# Patient Record
Sex: Female | Born: 1980 | Race: Black or African American | Hispanic: No | Marital: Single | Smoking: Current every day smoker
Health system: Southern US, Community
[De-identification: ages and names within clinical notes are randomized; demographics above are authoritative.]

## PROBLEM LIST (undated history)

## (undated) ENCOUNTER — Emergency Department (HOSPITAL_COMMUNITY): Admission: EM | Payer: Self-pay | Source: Home / Self Care

## (undated) DIAGNOSIS — F419 Anxiety disorder, unspecified: Secondary | ICD-10-CM

## (undated) DIAGNOSIS — D649 Anemia, unspecified: Secondary | ICD-10-CM

## (undated) DIAGNOSIS — N7093 Salpingitis and oophoritis, unspecified: Secondary | ICD-10-CM

## (undated) DIAGNOSIS — N730 Acute parametritis and pelvic cellulitis: Secondary | ICD-10-CM

## (undated) DIAGNOSIS — R7303 Prediabetes: Secondary | ICD-10-CM

## (undated) DIAGNOSIS — Z87828 Personal history of other (healed) physical injury and trauma: Secondary | ICD-10-CM

---

## 2013-02-21 HISTORY — PX: LAPAROSCOPIC APPENDECTOMY: SUR753

## 2018-05-23 DIAGNOSIS — N7093 Salpingitis and oophoritis, unspecified: Secondary | ICD-10-CM

## 2018-05-23 HISTORY — DX: Salpingitis and oophoritis, unspecified: N70.93

## 2018-06-16 ENCOUNTER — Emergency Department (HOSPITAL_COMMUNITY): Payer: Self-pay

## 2018-06-16 ENCOUNTER — Inpatient Hospital Stay (HOSPITAL_COMMUNITY)
Admission: EM | Admit: 2018-06-16 | Discharge: 2018-06-19 | DRG: 759 | Disposition: A | Discharge status: Home / Self Care | Payer: Self-pay | Attending: Obstetrics and Gynecology | Admitting: Obstetrics and Gynecology

## 2018-06-16 ENCOUNTER — Encounter (HOSPITAL_COMMUNITY): Payer: Self-pay | Admitting: Emergency Medicine

## 2018-06-16 ENCOUNTER — Other Ambulatory Visit: Payer: Self-pay

## 2018-06-16 DIAGNOSIS — N730 Acute parametritis and pelvic cellulitis: Secondary | ICD-10-CM | POA: Diagnosis present

## 2018-06-16 DIAGNOSIS — Z79899 Other long term (current) drug therapy: Secondary | ICD-10-CM

## 2018-06-16 DIAGNOSIS — R339 Retention of urine, unspecified: Secondary | ICD-10-CM | POA: Diagnosis present

## 2018-06-16 DIAGNOSIS — N939 Abnormal uterine and vaginal bleeding, unspecified: Secondary | ICD-10-CM | POA: Diagnosis present

## 2018-06-16 DIAGNOSIS — R7401 Elevation of levels of liver transaminase levels: Secondary | ICD-10-CM | POA: Diagnosis present

## 2018-06-16 DIAGNOSIS — R109 Unspecified abdominal pain: Secondary | ICD-10-CM | POA: Diagnosis present

## 2018-06-16 DIAGNOSIS — R338 Other retention of urine: Secondary | ICD-10-CM

## 2018-06-16 DIAGNOSIS — R740 Nonspecific elevation of levels of transaminase and lactic acid dehydrogenase [LDH]: Secondary | ICD-10-CM | POA: Diagnosis present

## 2018-06-16 DIAGNOSIS — K000 Anodontia: Secondary | ICD-10-CM

## 2018-06-16 DIAGNOSIS — K5900 Constipation, unspecified: Secondary | ICD-10-CM | POA: Diagnosis present

## 2018-06-16 DIAGNOSIS — K08109 Complete loss of teeth, unspecified cause, unspecified class: Secondary | ICD-10-CM | POA: Diagnosis present

## 2018-06-16 DIAGNOSIS — Z79891 Long term (current) use of opiate analgesic: Secondary | ICD-10-CM

## 2018-06-16 DIAGNOSIS — N7093 Salpingitis and oophoritis, unspecified: Principal | ICD-10-CM | POA: Diagnosis present

## 2018-06-16 HISTORY — DX: Salpingitis and oophoritis, unspecified: N70.93

## 2018-06-16 HISTORY — DX: Acute parametritis and pelvic cellulitis: N73.0

## 2018-06-16 LAB — WET PREP, GENITAL
Sperm: NONE SEEN
Trich, Wet Prep: NONE SEEN
Yeast Wet Prep HPF POC: NONE SEEN

## 2018-06-16 LAB — URINALYSIS, ROUTINE W REFLEX MICROSCOPIC
Bacteria, UA: NONE SEEN
Bilirubin Urine: NEGATIVE
Glucose, UA: NEGATIVE mg/dL
Ketones, ur: 20 mg/dL — AB
Leukocytes,Ua: NEGATIVE
Nitrite: NEGATIVE
Protein, ur: NEGATIVE mg/dL
Specific Gravity, Urine: >1.046 — ABNORMAL HIGH (ref 1.005–1.030)
pH: 7 (ref 5.0–8.0)

## 2018-06-16 LAB — CBC WITH DIFFERENTIAL/PLATELET
Abs Immature Granulocytes: 0.06 10*3/uL (ref 0.00–0.07)
Basophils Absolute: 0 10*3/uL (ref 0.0–0.1)
Basophils Relative: 0 %
Eosinophils Absolute: 0 10*3/uL (ref 0.0–0.5)
Eosinophils Relative: 0 %
HCT: 39.2 % (ref 36.0–46.0)
Hemoglobin: 12.4 g/dL (ref 12.0–15.0)
Immature Granulocytes: 0 %
Lymphocytes Relative: 6 %
Lymphs Abs: 0.9 10*3/uL (ref 0.7–4.0)
MCH: 28.2 pg (ref 26.0–34.0)
MCHC: 31.6 g/dL (ref 30.0–36.0)
MCV: 89.3 fL (ref 80.0–100.0)
Monocytes Absolute: 0.7 10*3/uL (ref 0.1–1.0)
Monocytes Relative: 5 %
Neutro Abs: 12.9 10*3/uL — ABNORMAL HIGH (ref 1.7–7.7)
Neutrophils Relative %: 89 %
Platelets: 383 10*3/uL (ref 150–400)
RBC: 4.39 MIL/uL (ref 3.87–5.11)
RDW: 13.2 % (ref 11.5–15.5)
WBC: 14.6 10*3/uL — ABNORMAL HIGH (ref 4.0–10.5)
nRBC: 0 % (ref 0.0–0.2)

## 2018-06-16 LAB — COMPREHENSIVE METABOLIC PANEL
ALT: 51 U/L — ABNORMAL HIGH (ref 0–44)
AST: 42 U/L — ABNORMAL HIGH (ref 15–41)
Albumin: 3.6 g/dL (ref 3.5–5.0)
Alkaline Phosphatase: 81 U/L (ref 38–126)
Anion gap: 11 (ref 5–15)
BUN: <5 mg/dL — ABNORMAL LOW (ref 6–20)
CO2: 25 mmol/L (ref 22–32)
Calcium: 9.3 mg/dL (ref 8.9–10.3)
Chloride: 102 mmol/L (ref 98–111)
Creatinine, Ser: 0.8 mg/dL (ref 0.44–1.00)
GFR calc Af Amer: >60 mL/min (ref >60)
GFR calc non Af Amer: >60 mL/min (ref >60)
Glucose, Bld: 108 mg/dL — ABNORMAL HIGH (ref 70–99)
Potassium: 4 mmol/L (ref 3.5–5.1)
Sodium: 138 mmol/L (ref 135–145)
Total Bilirubin: 0.5 mg/dL (ref 0.3–1.2)
Total Protein: 7.3 g/dL (ref 6.5–8.1)

## 2018-06-16 LAB — I-STAT BETA HCG BLOOD, ED (MC, WL, AP ONLY): I-stat hCG, quantitative: <5 m[IU]/mL (ref <5)

## 2018-06-16 MED ORDER — IOHEXOL 300 MG/ML  SOLN
100.0000 mL | Freq: Once | INTRAMUSCULAR | Status: AC | PRN
  Administered 2018-06-16: 100 mL via INTRAVENOUS

## 2018-06-16 MED ORDER — SODIUM CHLORIDE 0.9 % IV BOLUS
1000.0000 mL | Freq: Once | INTRAVENOUS | Status: AC
  Administered 2018-06-16: 20:00:00 1000 mL via INTRAVENOUS

## 2018-06-16 MED ORDER — SODIUM CHLORIDE 0.9 % IV SOLN
2.0000 g | Freq: Four times a day (QID) | INTRAVENOUS | Status: DC
  Administered 2018-06-17 – 2018-06-19 (×12): 2 g via INTRAVENOUS
  Filled 2018-06-16 (×17): qty 2

## 2018-06-16 MED ORDER — HYDROMORPHONE HCL 1 MG/ML IJ SOLN
0.5000 mg | Freq: Once | INTRAMUSCULAR | Status: AC
  Administered 2018-06-16: 0.5 mg via INTRAVENOUS
  Filled 2018-06-16: qty 1

## 2018-06-16 MED ORDER — LACTATED RINGERS IV SOLN
INTRAVENOUS | Status: DC
  Administered 2018-06-17 – 2018-06-19 (×4): via INTRAVENOUS

## 2018-06-16 MED ORDER — FENTANYL CITRATE (PF) 100 MCG/2ML IJ SOLN
50.0000 ug | Freq: Once | INTRAMUSCULAR | Status: AC
  Administered 2018-06-16: 50 ug via INTRAVENOUS
  Filled 2018-06-16: qty 2

## 2018-06-16 MED ORDER — DOXYCYCLINE HYCLATE 100 MG PO TABS
100.0000 mg | ORAL_TABLET | Freq: Two times a day (BID) | ORAL | Status: DC
  Administered 2018-06-16 – 2018-06-19 (×6): 100 mg via ORAL
  Filled 2018-06-16 (×7): qty 1

## 2018-06-16 MED ORDER — ONDANSETRON HCL 4 MG/2ML IJ SOLN
4.0000 mg | Freq: Once | INTRAMUSCULAR | Status: AC
  Administered 2018-06-16: 20:00:00 4 mg via INTRAVENOUS
  Filled 2018-06-16: qty 2

## 2018-06-16 MED ORDER — METRONIDAZOLE 500 MG PO TABS
500.0000 mg | ORAL_TABLET | Freq: Two times a day (BID) | ORAL | Status: DC
  Administered 2018-06-16 – 2018-06-19 (×6): 500 mg via ORAL
  Filled 2018-06-16 (×7): qty 1

## 2018-06-16 NOTE — ED Provider Notes (Signed)
Physical Exam  BP 135/79 (BP Location: Left Arm)   Pulse 98   Temp 98.6 F (37 C) (Oral)   Resp 14   SpO2 98%   Assumed care from Margarita Mail, PA-C at 1100. Briefly, the patient is a 38 y.o. female with PMHx of  has no past medical history on file. here with pelvic pain, constipation, and urinary retention.  Patient found to have tubo-ovarian abscess and PID.  Patient also found to have urinary retention greater than 800 mL in bladder, and OB/GYN recommending Foley catheter.  Patient does have a history of this she believes per patient in 2015.  Surgical history significant for appendectomy.  Labs Reviewed  WET PREP, GENITAL - Abnormal; Notable for the following components:      Result Value   Clue Cells Wet Prep HPF POC PRESENT (*)    WBC, Wet Prep HPF POC MANY (*)    All other components within normal limits  CBC WITH DIFFERENTIAL/PLATELET - Abnormal; Notable for the following components:   WBC 14.6 (*)    Neutro Abs 12.9 (*)    All other components within normal limits  URINALYSIS, ROUTINE W REFLEX MICROSCOPIC - Abnormal; Notable for the following components:   Specific Gravity, Urine >1.046 (*)    Hgb urine dipstick SMALL (*)    Ketones, ur 20 (*)    All other components within normal limits  COMPREHENSIVE METABOLIC PANEL - Abnormal; Notable for the following components:   Glucose, Bld 108 (*)    BUN <5 (*)    AST 42 (*)    ALT 51 (*)    All other components within normal limits  URINE CULTURE  RPR  HIV ANTIBODY (ROUTINE TESTING W REFLEX)  I-STAT BETA HCG BLOOD, ED ([facility], [facility], [facility] ONLY)  GC/CHLAMYDIA PROBE AMP ([place]) NOT AT [facility]    Course of Care:   Physical Exam Vitals signs and nursing note reviewed.  Constitutional:      General: She is not in acute distress.    Appearance: She is well-developed. She is not diaphoretic.     Comments: Sitting comfortably in bed.  HENT:     Head: Normocephalic and atraumatic.  Eyes:     General:        Right eye: No  discharge.        Left eye: No discharge.     Conjunctiva/sclera: Conjunctivae normal.     Comments: EOMs normal to gross examination.  Neck:     Musculoskeletal: Normal range of motion.  Cardiovascular:     Rate and Rhythm: Normal rate and regular rhythm.     Comments: Intact, 2+ radial pulse. Abdominal:     General: There is no distension.     Tenderness: There is abdominal tenderness.     Comments: Diffuse lower abdominal tenderness.  Musculoskeletal: Normal range of motion.  Skin:    General: Skin is warm and dry.  Neurological:     Mental Status: She is alert.     Comments: Cranial nerves intact to gross observation. Patient moves extremities without difficulty.  Psychiatric:        Behavior: Behavior normal.        Thought Content: Thought content normal.        Judgment: Judgment normal.     ED Course/Procedures     Procedures  MDM    12:01 AM Discussed case with Dr. Ilda Basset of gynecology.  He will primarily admit the patient.  Appreciate his involvement.  Also  recommends lactated Ringer infusion as well as insertion Foley catheter.       Albesa Seen, PA-C 06/17/18 0002    Tegeler, Gwenyth Allegra, MD 06/17/18 [phone]
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
## 2018-06-16 NOTE — ED Notes (Signed)
Bladder scanner showed bladder volume 240 mL

## 2018-06-17 ENCOUNTER — Encounter (HOSPITAL_COMMUNITY): Payer: Self-pay | Admitting: Obstetrics and Gynecology

## 2018-06-17 DIAGNOSIS — R7401 Elevation of levels of liver transaminase levels: Secondary | ICD-10-CM | POA: Diagnosis present

## 2018-06-17 DIAGNOSIS — K5900 Constipation, unspecified: Secondary | ICD-10-CM | POA: Diagnosis present

## 2018-06-17 DIAGNOSIS — N7093 Salpingitis and oophoritis, unspecified: Principal | ICD-10-CM

## 2018-06-17 DIAGNOSIS — R740 Nonspecific elevation of levels of transaminase and lactic acid dehydrogenase [LDH]: Secondary | ICD-10-CM

## 2018-06-17 DIAGNOSIS — R339 Retention of urine, unspecified: Secondary | ICD-10-CM | POA: Diagnosis present

## 2018-06-17 DIAGNOSIS — K000 Anodontia: Secondary | ICD-10-CM

## 2018-06-17 DIAGNOSIS — N939 Abnormal uterine and vaginal bleeding, unspecified: Secondary | ICD-10-CM | POA: Diagnosis present

## 2018-06-17 DIAGNOSIS — K08109 Complete loss of teeth, unspecified cause, unspecified class: Secondary | ICD-10-CM | POA: Diagnosis present

## 2018-06-17 DIAGNOSIS — R109 Unspecified abdominal pain: Secondary | ICD-10-CM | POA: Diagnosis present

## 2018-06-17 LAB — CBC WITH DIFFERENTIAL/PLATELET
Abs Immature Granulocytes: 0.05 10*3/uL (ref 0.00–0.07)
Basophils Absolute: 0 10*3/uL (ref 0.0–0.1)
Basophils Relative: 0 %
Eosinophils Absolute: 0 10*3/uL (ref 0.0–0.5)
Eosinophils Relative: 0 %
HCT: 34.4 % — ABNORMAL LOW (ref 36.0–46.0)
Hemoglobin: 10.8 g/dL — ABNORMAL LOW (ref 12.0–15.0)
Immature Granulocytes: 0 %
Lymphocytes Relative: 8 %
Lymphs Abs: 1.1 10*3/uL (ref 0.7–4.0)
MCH: 27.6 pg (ref 26.0–34.0)
MCHC: 31.4 g/dL (ref 30.0–36.0)
MCV: 88 fL (ref 80.0–100.0)
Monocytes Absolute: 0.9 10*3/uL (ref 0.1–1.0)
Monocytes Relative: 7 %
Neutro Abs: 11.3 10*3/uL — ABNORMAL HIGH (ref 1.7–7.7)
Neutrophils Relative %: 85 %
Platelets: 348 10*3/uL (ref 150–400)
RBC: 3.91 MIL/uL (ref 3.87–5.11)
RDW: 13.4 % (ref 11.5–15.5)
WBC: 13.4 10*3/uL — ABNORMAL HIGH (ref 4.0–10.5)
nRBC: 0 % (ref 0.0–0.2)

## 2018-06-17 LAB — RPR: RPR Ser Ql: NONREACTIVE

## 2018-06-17 LAB — HIV ANTIBODY (ROUTINE TESTING W REFLEX): HIV Screen 4th Generation wRfx: NONREACTIVE

## 2018-06-17 MED ORDER — LACTATED RINGERS IV SOLN
INTRAVENOUS | Status: DC
  Administered 2018-06-17: 02:00:00 125 mL/h via INTRAVENOUS

## 2018-06-17 MED ORDER — POLYETHYLENE GLYCOL 3350 17 G PO PACK
17.0000 g | PACK | Freq: Two times a day (BID) | ORAL | Status: DC
  Administered 2018-06-17 – 2018-06-18 (×4): 17 g via ORAL
  Filled 2018-06-17 (×4): qty 1

## 2018-06-17 MED ORDER — ZOLPIDEM TARTRATE 5 MG PO TABS
5.0000 mg | ORAL_TABLET | Freq: Every evening | ORAL | Status: DC | PRN
  Administered 2018-06-18: 5 mg via ORAL
  Filled 2018-06-17: qty 1

## 2018-06-17 MED ORDER — ONDANSETRON HCL 4 MG/2ML IJ SOLN
4.0000 mg | Freq: Four times a day (QID) | INTRAMUSCULAR | Status: DC | PRN
  Administered 2018-06-17: 4 mg via INTRAVENOUS
  Filled 2018-06-17: qty 2

## 2018-06-17 MED ORDER — IBUPROFEN 600 MG PO TABS
600.0000 mg | ORAL_TABLET | Freq: Four times a day (QID) | ORAL | Status: DC
  Administered 2018-06-17 – 2018-06-18 (×5): 600 mg via ORAL
  Administered 2018-06-18 – 2018-06-19 (×3): 300 mg via ORAL
  Filled 2018-06-17 (×9): qty 1

## 2018-06-17 MED ORDER — IBUPROFEN 600 MG PO TABS: 600.0000 mg | ORAL_TABLET | Freq: Four times a day (QID) | ORAL | Status: DC | PRN

## 2018-06-17 MED ORDER — POLYETHYLENE GLYCOL 3350 17 G PO PACK: 17.0000 g | PACK | Freq: Every day | ORAL | Status: DC

## 2018-06-17 MED ORDER — ONDANSETRON HCL 4 MG PO TABS
4.0000 mg | ORAL_TABLET | Freq: Four times a day (QID) | ORAL | Status: DC | PRN
  Administered 2018-06-19: 4 mg via ORAL
  Filled 2018-06-17: qty 1

## 2018-06-17 MED ORDER — ACETAMINOPHEN 500 MG PO TABS
1000.0000 mg | ORAL_TABLET | Freq: Four times a day (QID) | ORAL | Status: DC
  Administered 2018-06-17: 1000 mg via ORAL
  Filled 2018-06-17: qty 2

## 2018-06-17 MED ORDER — OXYCODONE HCL 5 MG PO TABS
5.0000 mg | ORAL_TABLET | Freq: Four times a day (QID) | ORAL | Status: DC | PRN
  Administered 2018-06-17 – 2018-06-19 (×7): 10 mg via ORAL
  Filled 2018-06-17 (×7): qty 2

## 2018-06-17 MED ORDER — PRENATAL MULTIVITAMIN CH
1.0000 | ORAL_TABLET | Freq: Every day | ORAL | Status: DC
  Filled 2018-06-17: qty 1

## 2018-06-17 MED ORDER — ACETAMINOPHEN 325 MG PO TABS
650.0000 mg | ORAL_TABLET | Freq: Once | ORAL | Status: AC
  Administered 2018-06-17: 01:00:00 650 mg via ORAL
  Filled 2018-06-17: qty 2
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
## 2018-06-17 NOTE — H&P (Addendum)
Obstetrics & Gynecology H&P   Date of Admission: 06/17/2018   Requesting Provider: [facility] ED  Primary OBGYN: None Primary Care Provider: Patient, No Pcp Per  Reason for Admission: toa, pelvic abscess  History of Present Illness: [name] is a 38 y.o. G0 (No LMP recorded.), with the above CC. PMHx is significant for h/o PID, GC, l/s appy.  Patient with AUB for past few months and with increasing pain over last few days. Pain similar to in 2015 when they thought she had an appy but on l/s they said she had gonorrhea and PID; appendix was still removed.   Patient also having issues with urinary retention and voiding. Pt seen in ED and cT showed pelvic fluid collection and TOA. Pt recently released from jail and has been sexually active.   Taking PO w/o issue, foley is in place, only spotting with wiping and pain is stable.     ROS: A 12-point review of systems was performed and negative, except as stated in the above HPI.  OBGYN History: As per HPI. OB History  Gravida Para Term Preterm AB Living  0 0 0 0 0 0  SAB TAB Ectopic Multiple Live Births  0 0 0 0 0    History of pap smears:  Pt unsure History of STIs: Yes She is currently using nothing for contraception.    Past Medical History: Past Medical History:  Diagnosis Date  . PID (acute pelvic inflammatory disease)     Past Surgical History: Past Surgical History:  Procedure Laterality Date  . LAPAROSCOPIC APPENDECTOMY  2015   turned out to be PID/TOA.     Family History:  No family history on file.  Social History:  Social History   Socioeconomic History  . Marital status: Single    Spouse name: Not on file  . Number of children: Not on file  . Years of education: Not on file  . Highest education level: Not on file  Occupational History  . Not on file  Social Needs  . Financial resource strain: Not on file  . Food insecurity:    Worry: Not on file    Inability: Not on file  . Transportation needs:   Medical: Not on file    Non-medical: Not on file  Tobacco Use  . Smoking status: Not on file  Substance and Sexual Activity  . Alcohol use: Not on file  . Drug use: Not on file  . Sexual activity: Not on file  Lifestyle  . Physical activity:    Days per week: Not on file    Minutes per session: Not on file  . Stress: Not on file  Relationships  . Social connections:    Talks on phone: Not on file    Gets together: Not on file    Attends religious service: Not on file    Active member of club or organization: Not on file    Attends meetings of clubs or organizations: Not on file    Relationship status: Not on file  . Intimate partner violence:    Fear of current or ex partner: Not on file    Emotionally abused: Not on file    Physically abused: Not on file    Forced sexual activity: Not on file  Other Topics Concern  . Not on file  Social History Narrative   H/o incarceration     Allergy: Not on File  Current Outpatient Medications: Medications Prior to Admission  Medication Sig Dispense  Refill Last Dose  . bisacodyl (FLEET) 10 MG/30ML ENEM Place 10 mg rectally daily as needed (Constipation).   06/16/2018 at Unknown time  . Bisacodyl (LAXATIVE PO) Take 1 tablet by mouth daily as needed (Constipation).   06/16/2018 at Unknown time  . Multiple Vitamin (MULTIVITAMIN WITH MINERALS) TABS tablet Take 1 tablet by mouth daily.   06/16/2018 at Unknown time  . oxyCODONE-acetaminophen (PERCOCET) 5-325 MG tablet Take 1 tablet by mouth every 8 (eight) hours as needed (Pain).   06/16/2018 at Unknown time     Hospital Medications: Current Facility-Administered Medications  Medication Dose Route Frequency Provider Last Rate Last Dose  . cefOXitin (MEFOXIN) 2 g in sodium chloride 0.9 % 100 mL IVPB  2 g Intravenous Q6H Aletha Halim, MD 200 mL/hr at 06/17/18 0705 2 g at 06/17/18 0705  . doxycycline (VIBRA-TABS) tablet 100 mg  100 mg Oral Q12H Aletha Halim, MD   100 mg at 06/16/18  2252  . ibuprofen (ADVIL) tablet 600 mg  600 mg Oral Q6H Aletha Halim, MD   600 mg at 06/17/18 0701  . lactated ringers infusion   Intravenous Continuous Langston Masker B, PA-C 125 mL/hr at 06/17/18 0037    . metroNIDAZOLE (FLAGYL) tablet 500 mg  500 mg Oral Q12H Aletha Halim, MD   500 mg at 06/16/18 2252  . ondansetron (ZOFRAN) tablet 4 mg  4 mg Oral Q6H PRN Aletha Halim, MD       Or  . ondansetron (ZOFRAN) injection 4 mg  4 mg Intravenous Q6H PRN Aletha Halim, MD      . oxyCODONE (Oxy IR/ROXICODONE) immediate release tablet 5-10 mg  5-10 mg Oral Q6H PRN Aletha Halim, MD   10 mg at 06/17/18 0701  . polyethylene glycol (MIRALAX / GLYCOLAX) packet 17 g  17 g Oral BID Aletha Halim, MD      . zolpidem (AMBIEN) tablet 5 mg  5 mg Oral QHS PRN Aletha Halim, MD         Physical Exam:   Current Vital Signs 24h Vital Sign Ranges  T 98.6 F (37 C) Temp  Avg: 98.9 F (37.2 C)  Min: 98.6 F (37 C)  Max: 99.4 F (37.4 C)  BP (!) 90/47 BP  Min: 90/47  Max: 144/85  HR 91 Pulse  Avg: 100.8  Min: 91  Max: 111  RR 14 Resp  Avg: 16  Min: 14  Max: 18  SaO2 97 % Room Air SpO2  Avg: 98.2 %  Min: 97 %  Max: 100 %       24 Hour I/O Current Shift I/O  Time Ins Outs 04/25 0701 - 04/26 0700 In: 2171.9 [I.V.:1081.9] Out: 800 [Urine:800] No intake/output data recorded.   Patient Vitals for the past 24 hrs:  BP Temp Temp src Pulse Resp SpO2  06/17/18 0515 (!) 90/47 98.6 F (37 C) Oral 91 - 97 %  06/17/18 0135 - - - (!) 105 - 97 %  06/17/18 0106 - 99.4 F (37.4 C) Oral - - -  06/17/18 0039 (!) 144/85 - - 99 - 99 %  06/16/18 2224 135/79 - - 98 14 98 %  06/16/18 1825 (!) 132/93 98.6 F (37 C) Oral (!) 111 18 100 %    There is no height or weight on file to calculate BMI. General appearance: Well nourished, well developed female in no acute distress.  Neck:  Supple, normal appearance, and no thyromegaly  Cardiovascular: S1, S2 normal, no murmur, rub or gallop,  regular rate  and rhythm Respiratory:  Clear to auscultation bilateral. Normal respiratory effort Abdomen: +BS, soft, nd, mild to moderately ttp in the mid to low belly, no peritoneal s/s Neuro/Psych:  Normal mood and affect.  Skin:  Warm and dry.  Extremities: no clubbing, cyanosis, or edema.   Laboratory: Pending: GC/CT, UCx, hiv, rpr Negative: beta hcg,  WP: +clue cells and wbc Recent Labs  Lab 06/16/18 1913  WBC 14.6*  HGB 12.4  HCT 39.2  PLT 383   Recent Labs  Lab 06/16/18 1913  NA 138  K 4.0  CL 102  CO2 25  BUN <5*  CREATININE 0.80  CALCIUM 9.3  PROT 7.3  BILITOT 0.5  ALKPHOS 81  ALT 51*  AST 42*  GLUCOSE 108*    Imaging:  CLINICAL DATA:  Abdominal pain  EXAM: CT ABDOMEN AND PELVIS WITH CONTRAST  TECHNIQUE: Multidetector CT imaging of the abdomen and pelvis was performed using the standard protocol following bolus administration of intravenous contrast.  CONTRAST:  150mL OMNIPAQUE IOHEXOL 300 MG/ML  SOLN  COMPARISON:  None.  FINDINGS: Lower chest: No acute abnormality.  Hepatobiliary: No focal liver abnormality is seen. No gallstones, gallbladder wall thickening, or biliary dilatation.  Pancreas: Unremarkable. No pancreatic ductal dilatation or surrounding inflammatory changes.  Spleen: Normal in size without focal abnormality.  Adrenals/Urinary Tract: Adrenal glands are unremarkable. Kidneys are normal, without renal calculi, focal lesion, or hydronephrosis. Bladder is unremarkable.  Stomach/Bowel: Stomach is nonenlarged. No dilated small bowel. Patient appears to be status post appendectomy.  Vascular/Lymphatic: No significant vascular findings are present. No enlarged abdominal or pelvic lymph nodes.  Reproductive: Uterus is unremarkable.  Other: Inflammatory tubular structure within the central pelvis measuring 3.8 cm. Edema and inflammatory change is present. No free air. Loculated pelvic fluid posterior to the uterus and  exerting mass effect on the rectum.  Musculoskeletal: Degenerative changes at L5-S1.  IMPRESSION: 1. Pelvic inflammatory process. Thick-walled tubular structure within the central pelvis with surrounding inflammatory change, somewhat difficult to separate from adjacent unopacified small bowel loops. Differential considerations include tubo-ovarian abscess/pyosalpinx versus abnormally thickened loop of small bowel as may be seen with infectious or inflammatory enteritis. 2. Moderate loculated fluid in the pelvis which is exerting mass effect on the rectum.   Electronically Signed   By: Donavan Foil M.D.   On: 06/16/2018 21:36  Assessment: Cheryl Wiley is a 38 y.o. G0 (No LMP recorded.) who presented to the ED with complaints of abdominal; findings are consistent with PID/TOA. Pt currently stable.  Plan: *Admit to GYN *continue triple abx and f/u cultures. Hopefully can transition to PO meds tomorrow and avoid need for surgical intervention. Pt will need GYN outpatient follow up for pap, etc.  *Elevated ast/alt: f/u acute hep panel. Avoid tylenol.  *FEN/GI: can d/c MIVF, regular diet *PPx: SCDs, OOB ad lib *Pain: continue with PO PRNs *Dispo: see above  Total time taking care of the patient was 25 minutes, with greater than 50% of the time spent in face to face interaction with the patient.  Durene Romans MD Attending Center for [facility])

## 2018-06-17 NOTE — Progress Notes (Signed)
Elonda Husky, MD returned page. No new orders at this time. Will continue to monitor.

## 2018-06-17 NOTE — Plan of Care (Signed)

## 2018-06-17 NOTE — Progress Notes (Addendum)
Oral temp 101.6. No Tylenol available. [facility] for [place] contacted. Awaiting on call MD's response. Will continue to monitor.

## 2018-06-18 ENCOUNTER — Encounter (HOSPITAL_COMMUNITY): Payer: Self-pay | Admitting: General Practice

## 2018-06-18 LAB — HEPATITIS PANEL, ACUTE
HCV Ab: <0.1 s/co ratio (ref 0.0–0.9)
Hep A IgM: NEGATIVE
Hep B C IgM: NEGATIVE
Hepatitis B Surface Ag: NEGATIVE

## 2018-06-18 LAB — URINE CULTURE: Culture: NO GROWTH

## 2018-06-18 LAB — GC/CHLAMYDIA PROBE AMP (~~LOC~~) NOT AT ARMC
Chlamydia: NEGATIVE
Neisseria Gonorrhea: NEGATIVE

## 2018-06-18 NOTE — Progress Notes (Signed)
Subjective: Patient reports improvement in her abdominal pain in comparison to admission. She denies nausea or emesis    Objective: I have reviewed patient's vital signs and medications. Vitals:   06/17/18 2255 06/18/18 0409  BP: 133/74 110/71  Pulse: (!) 115 93  Resp:    Temp: 100.1 F (37.8 C) 98.3 F (36.8 C)  SpO2: 98% 100%     General: alert, cooperative and no distress Resp: clear to auscultation bilaterally Cardio: regular rate and rhythm GI: soft, non-tender; bowel sounds normal; no masses,  no organomegaly Extremities: extremities normal, atraumatic, no cyanosis or edema   Assessment/Plan: 38 yo with TOA vs pyosalpinx  - Continue inpatient monitoring - Continue IV antibiotics until 48 hours afebrile - Will discontinue foley catheter - Continue current care  LOS: 2 days    Cheryl Wiley 06/18/2018, 10:07 AM

## 2018-06-18 NOTE — Plan of Care (Signed)

## 2018-06-18 NOTE — TOC Initial Note (Addendum)
Transition of Care [facility]) - Initial/Assessment Note    Patient Details  Name: Cheryl Wiley MRN: [id number] Date of Birth: [birth year]  [facility] CM/SW Contact:    Marilu Favre, RN Phone Number: 06/18/2018, 10:53 AM  Clinical Narrative:                 Patient from home. Confirmed face sheet information. Patient lives with both parents and her nephew. Patient is uninsured and wants to follow up with [facility]. Called to arrange an appointment for Jun 25, 2018 at 11:10 am   On day of discharge NCM will assist with discharge prescriptions ( pain medication not covered), like antibiotics. MD please [facility] prescriptions to [facility].  Expected Discharge Plan: Home/Self Care Barriers to Discharge: Continued Medical Work up   Patient Goals and CMS Choice Patient states their goals for this hospitalization and ongoing recovery are:: to go home  CMS Medicare.gov Compare Post Acute Care list provided to:: Patient Choice offered to / list presented to : NA  Expected Discharge Plan and Services Expected Discharge Plan: Home/Self Care In-house Referral: Financial Counselor Discharge Planning Services: CM Consult, Medication Assistance, [facility], [facility]   Living arrangements for the past 2 months: Single Family Home                 DME Arranged: N/A DME Agency: NA       HH Arranged: NA HH Agency: NA        Prior Living Arrangements/Services Living arrangements for the past 2 months: Single Family Home Lives with:: Parents Patient language and need for interpreter reviewed:: Yes Do you feel safe going back to the place where you live?: Yes      Need for Family Participation in Patient Care: No (Comment) Care giver support system in place?: No (comment)   Criminal Activity/Legal Involvement Pertinent to Current Situation/Hospitalization: No - Comment as needed  Activities of Daily Living       Permission Sought/Granted Permission sought to share information with : Other (comment) Permission granted to share information with : Yes, Verbal Permission Granted  Share Information with NAME: [facility]           Emotional Assessment Appearance:: Appears stated age Attitude/Demeanor/Rapport: Engaged Affect (typically observed): Accepting Orientation: : Oriented to Self, Oriented to Place, Oriented to  Time, Oriented to Situation   Psych Involvement: No (comment)  Admission diagnosis:  PID (acute pelvic inflammatory disease) [N73.0] Acute urinary retention [R33.8] TOA (tubo-ovarian abscess) [N70.93] Patient Active Problem List   Diagnosis Date Noted  . Abdominal pain 06/17/2018  . Transaminitis 06/17/2018  . Urinary retention 06/17/2018  . Abnormal uterine bleeding (AUB) 06/17/2018  . Constipation 06/17/2018  . 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 TOA (tubo-ovarian abscess) 06/16/2018   PCP:  Patient, No Pcp Per Pharmacy:   [facility] [place], [place] [address] [place] [address] Phone: [phone] Fax: [phone]     Social Determinants of Health ([place]) Interventions    Readmission Risk Interventions No flowsheet data found.

## 2018-06-19 LAB — CBC
HCT: 34 % — ABNORMAL LOW (ref 36.0–46.0)
Hemoglobin: 10.9 g/dL — ABNORMAL LOW (ref 12.0–15.0)
MCH: 28.3 pg (ref 26.0–34.0)
MCHC: 32.1 g/dL (ref 30.0–36.0)
MCV: 88.3 fL (ref 80.0–100.0)
Platelets: 432 10*3/uL — ABNORMAL HIGH (ref 150–400)
RBC: 3.85 MIL/uL — ABNORMAL LOW (ref 3.87–5.11)
RDW: 13.8 % (ref 11.5–15.5)
WBC: 14.5 10*3/uL — ABNORMAL HIGH (ref 4.0–10.5)
nRBC: 0 % (ref 0.0–0.2)

## 2018-06-19 MED ORDER — IBUPROFEN 600 MG PO TABS: 600.0000 mg | ORAL_TABLET | Freq: Four times a day (QID) | ORAL | 0 refills | Status: DC

## 2018-06-19 MED ORDER — ACETAMINOPHEN 500 MG PO TABS: 1000.0000 mg | ORAL_TABLET | Freq: Four times a day (QID) | ORAL | 0 refills | Status: DC | PRN

## 2018-06-19 MED ORDER — METRONIDAZOLE 500 MG PO TABS: 500.0000 mg | ORAL_TABLET | Freq: Two times a day (BID) | ORAL | 0 refills | Status: DC

## 2018-06-19 MED ORDER — OXYCODONE HCL 5 MG PO TABS: 5.0000 mg | ORAL_TABLET | Freq: Four times a day (QID) | ORAL | 0 refills | Status: DC | PRN

## 2018-06-19 MED ORDER — ONDANSETRON 4 MG PO TBDP: 4.0000 mg | ORAL_TABLET | Freq: Three times a day (TID) | ORAL | 0 refills | Status: DC | PRN

## 2018-06-19 MED ORDER — ONDANSETRON 4 MG PO TBDP: 4.0000 mg | ORAL_TABLET | Freq: Three times a day (TID) | ORAL | Status: DC | PRN

## 2018-06-19 MED ORDER — ACETAMINOPHEN 500 MG PO TABS
1000.0000 mg | ORAL_TABLET | Freq: Four times a day (QID) | ORAL | Status: DC | PRN
  Administered 2018-06-19: 10:00:00 1000 mg via ORAL
  Filled 2018-06-19: qty 2

## 2018-06-19 MED ORDER — DOXYCYCLINE HYCLATE 100 MG PO TABS: 100.0000 mg | ORAL_TABLET | Freq: Two times a day (BID) | ORAL | 0 refills | Status: DC

## 2018-06-19 MED FILL — metroNIDAZOLE 500 MG TABS: 500 | 14 days supply | Qty: 28 | Fill #0

## 2018-06-19 MED FILL — IBUPROFEN 600 MG TABLET: 600 | 8 days supply | Qty: 30 | Fill #0

## 2018-06-19 MED FILL — DOXYCYCLINE HYCLATE 100 MG: 100 | 14 days supply | Qty: 28 | Fill #0

## 2018-06-19 MED FILL — oxyCODONE HCL 5 MG TABS: 5 | 2 days supply | Qty: 15 | Fill #0

## 2018-06-19 MED FILL — ONDANSETRON ODT 4 MG TABLET: 4 | 7 days supply | Qty: 20 | Fill #0

## 2018-06-19 NOTE — TOC Transition Note (Addendum)
Transition of Care [facility]) - CM/SW Discharge Note   Patient Details  Name: Cheryl Wiley MRN: [id number] Date of Birth: [birth year]  Transition [facility] Care [facility]) CM/SW Contact:  Marilu Favre, RN Phone Number: 06/19/2018, 12:47 PM   Clinical Narrative:      Patient has follow up appointment at [facility].   Prescriptions sent to [place]. Entered patient in [facility] program with co pay over rides for Flagyl , ibuprofen, and Doxycycline.    Cost of Tylenol is $1 and cost of oxycodone is $9. Patient aware , she does not want Tylenol ( has some at home). She can pay for oxycodone. [place] pharmacy aware. Final next level of care: Home/Self Care Barriers to Discharge: No Barriers Identified   Patient Goals and CMS Choice Patient states their goals for this hospitalization and ongoing recovery are:: to go home  CMS Medicare.gov Compare Post Acute Care list provided to:: Patient Choice offered to / list presented to : NA  Discharge Placement                       Discharge Plan and Services In-house Referral: Financial Counselor Discharge Planning Services: CM Consult, Medication Assistance, [facility]            DME Arranged: N/A DME Agency: NA       HH Arranged: NA HH Agency: NA        Social Determinants of Health (SDOH) Interventions     Readmission Risk Interventions No flowsheet data found.

## 2018-06-19 NOTE — Progress Notes (Signed)
1530 Pt had several loose stools today, nausea noted. Dr Elly Modena notified. Pt wants to go home after IV antibiotic is complete.  1845 Discharge instructions given, discharged to home picked up by her mother.

## 2018-06-19 NOTE — Discharge Instructions (Signed)
Pelvic Inflammatory Disease Pelvic inflammatory disease (PID) is an infection in some or all of the female organs. PID can be in the uterus, ovaries, fallopian tubes, or the surrounding tissues that are inside the lower belly area (pelvis). PID can lead to lasting problems if it is not treated. To check for this disease, your doctor may:  Do a physical exam.  Do blood tests, urine tests, or a pregnancy test.  Look at your vaginal discharge.  Do tests to look inside the pelvis.  Test you for other infections. Follow these instructions at home:  Take over-the-counter and prescription medicines only as told by your doctor.  If you were prescribed an antibiotic medicine, take it as told by your doctor. Do not stop taking it even if you start to feel better.  Do not have sex until treatment is done or as told by your doctor.  Tell your sex partner if you have PID. Your partner may need to be treated.  Keep all follow-up visits as told by your doctor. This is important.  Your doctor may test you for infection again 3 months after you are treated. Contact a doctor if:  You have more fluid (discharge) coming from your vagina or fluid that is not normal.  Your pain does not improve.  You throw up (vomit).  You have a fever.  You cannot take your medicines.  Your partner has a sexually transmitted disease (STD).  You have pain when you pee (urinate). Get help right away if:  You have more belly (abdominal) or lower belly pain.  You have chills.  You are not better after 72 hours. This information is not intended to replace advice given to you by your health care provider. Make sure you discuss any questions you have with your health care provider. Document Released: 05/06/2008 Document Revised: 07/16/2015 Document Reviewed: 03/17/2014 Elsevier Interactive Patient Education  2019 [employer].

## 2018-06-19 NOTE — Plan of Care (Signed)
  Problem: Health Behavior/Discharge Planning: Goal: Ability to manage health-related needs will improve Outcome: Progressing   Problem: Activity: Goal: Risk for activity intolerance will decrease Outcome: Progressing   Problem: Elimination: Goal: Will not experience complications related to bowel motility Outcome: Progressing   Problem: Pain Managment: Goal: General experience of comfort will improve Outcome: Progressing   

## 2018-06-19 NOTE — Discharge Summary (Signed)
OB Discharge Summary     Patient Name: Cheryl Wiley DOB: [birth year] MRN: [id number]  Date of admission: 06/16/2018 Delivering MD: This patient has no babies on file.  Date of discharge: 06/19/2018  Admitting diagnosis: PID (acute pelvic inflammatory disease) [N73.0] Acute urinary retention [R33.8] TOA (tubo-ovarian abscess) [N70.93] Intrauterine pregnancy: Unknown     Secondary diagnosis:  Active Problems:   TOA (tubo-ovarian abscess)   Abdominal pain   Transaminitis   Urinary retention   Abnormal uterine bleeding (AUB)   Constipation   Edentulous     Discharge diagnosis: same                                                                                                Hospital course:  Patient admitted with abdominal pain and CT findings concerning for TOA. Patient was admitted to receive parental treatment. She remained afebrile for 48 hours. Tolerated oral intake and was able to void without difficulty. Patient requested to be discharged today, reporting a significant improvement in her pain. Patient will be discharged home to complete a 14-day course of antibiotics. She will follow up in 2 weeks in our office. Discharge instructions reviewed with the patient.   Physical exam  Vitals:   06/18/18 1940 06/19/18 0355 06/19/18 0540 06/19/18 1411  BP: 127/76 112/71  130/73  Pulse: 82 (!) 102 98 96  Resp: 16 16    Temp: 98.9 F (37.2 C)  99.8 F (37.7 C) 99 F (37.2 C)  TempSrc: Oral  Oral Oral  SpO2: 99% 99%  100%   GENERAL: Well-developed, well-nourished female in no acute distress.  HEENT: Normocephalic, atraumatic. Sclerae anicteric.  NECK: Supple. Normal thyroid.  LUNGS: Clear to auscultation bilaterally.  HEART: Regular rate and rhythm. ABDOMEN: Soft, nontender, nondistended. No organomegaly. EXTREMITIES: No cyanosis, clubbing, or edema, 2+ distal pulses.  Labs: Lab Results  Component Value Date   WBC 14.5 (H) 06/19/2018   HGB 10.9 (L) 06/19/2018   HCT  34.0 (L) 06/19/2018   MCV 88.3 06/19/2018   PLT 432 (H) 06/19/2018   CMP Latest Ref Rng & Units 06/16/2018  Glucose 70 - 99 mg/dL 108(H)  BUN 6 - 20 mg/dL <5(L)  Creatinine 0.44 - 1.00 mg/dL 0.80  Sodium 135 - 145 mmol/L 138  Potassium 3.5 - 5.1 mmol/L 4.0  Chloride 98 - 111 mmol/L 102  CO2 22 - 32 mmol/L 25  Calcium 8.9 - 10.3 mg/dL 9.3  Total Protein 6.5 - 8.1 g/dL 7.3  Total Bilirubin 0.3 - 1.2 mg/dL 0.5  Alkaline Phos 38 - 126 U/L 81  AST 15 - 41 U/L 42(H)  ALT 0 - 44 U/L 51(H)    Discharge instruction: per After Visit Summary and "Baby and Me Booklet".  After visit meds:  Allergies as of 06/19/2018   Not on File     Medication List    STOP taking these medications   Percocet 5-325 MG tablet Generic drug:  oxyCODONE-acetaminophen     TAKE these medications   acetaminophen 500 MG tablet Commonly known as:  TYLENOL Take 2 tablets (1,000 mg total) by  mouth every 6 (six) hours as needed for fever or headache.   bisacodyl 10 MG/30ML Enem Commonly known as:  FLEET Place 10 mg rectally daily as needed (Constipation).   doxycycline 100 MG tablet Commonly known as:  VIBRA-TABS Take 1 tablet (100 mg total) by mouth every 12 (twelve) hours.   ibuprofen 600 MG tablet Commonly known as:  ADVIL Take 1 tablet (600 mg total) by mouth every 6 (six) hours.   LAXATIVE PO Take 1 tablet by mouth daily as needed (Constipation).   metroNIDAZOLE 500 MG tablet Commonly known as:  FLAGYL Take 1 tablet (500 mg total) by mouth every 12 (twelve) hours.   multivitamin with minerals Tabs tablet Take 1 tablet by mouth daily.   ondansetron 4 MG disintegrating tablet Commonly known as:  ZOFRAN-ODT Take 1 tablet (4 mg total) by mouth every 8 (eight) hours as needed for nausea or vomiting.   oxyCODONE 5 MG immediate release tablet Commonly known as:  Oxy IR/ROXICODONE Take 1-2 tablets (5-10 mg total) by mouth every 6 (six) hours as needed for severe pain.       Diet: routine  diet  Activity: Advance as tolerated. Pelvic rest for 6 weeks.   Outpatient follow up:2 weeks Follow up Appt: Future Appointments  Date Time Provider [facility]  06/25/2018 11:10 AM [name], MD [id number] None     06/19/2018 Mora Bellman, MD

## 2018-06-25 ENCOUNTER — Other Ambulatory Visit: Payer: Self-pay

## 2018-06-25 ENCOUNTER — Encounter: Payer: Self-pay | Admitting: Family Medicine

## 2018-06-25 ENCOUNTER — Telehealth: Payer: Self-pay | Admitting: *Deleted

## 2018-06-25 ENCOUNTER — Ambulatory Visit: Payer: Self-pay | Attending: Family Medicine | Admitting: Family Medicine

## 2018-06-25 VITALS — BP 124/89 | HR 72 | Temp 98.2°F | Ht 65.0 in | Wt 220.4 lb

## 2018-06-25 DIAGNOSIS — Z09 Encounter for follow-up examination after completed treatment for conditions other than malignant neoplasm: Secondary | ICD-10-CM

## 2018-06-25 DIAGNOSIS — R739 Hyperglycemia, unspecified: Secondary | ICD-10-CM

## 2018-06-25 DIAGNOSIS — R7401 Elevation of levels of liver transaminase levels: Secondary | ICD-10-CM

## 2018-06-25 DIAGNOSIS — E010 Iodine-deficiency related diffuse (endemic) goiter: Secondary | ICD-10-CM

## 2018-06-25 DIAGNOSIS — D649 Anemia, unspecified: Secondary | ICD-10-CM

## 2018-06-25 DIAGNOSIS — N7093 Salpingitis and oophoritis, unspecified: Secondary | ICD-10-CM

## 2018-06-25 DIAGNOSIS — R740 Nonspecific elevation of levels of transaminase and lactic acid dehydrogenase [LDH]: Secondary | ICD-10-CM

## 2018-06-25 LAB — POCT GLYCOSYLATED HEMOGLOBIN (HGB A1C): Hemoglobin A1C: 5.9 % — AB (ref 4.0–5.6)

## 2018-06-25 NOTE — Progress Notes (Signed)
Hospital f/u for abd pain and could not urinate. Per pt after the hospital did a CAT scan, they said they saw a Cyct and would like for patient to remove it. Per pt she do not have insurance.

## 2018-06-25 NOTE — Progress Notes (Signed)
Patient ID: Cheryl Wiley, female    DOB: 1981-01-22  MRN: [id number]   SUBJECTIVE:  Cheryl Wiley is a 38 y.o. female  new to the practicewho presents for hospital f/u. Where: [facility] When: 06/16/2018-06/19/2018 Primary Dx: Janene Madeira (tubo-ovarian abscess) She was discharged on 14 days of antibiotics (doxycycline and metronidazole) and is to follow-up with OB/GYN in 2 weeks status post hospital discharge  HPI:      38 year old female who is status post hospital admission after presenting to the ED with complaint of abdominal pain similar to past pain with pelvic inflammatory disease, urinary retention and difficulty voiding and complaint of irregular menstrual type bleeding.  CT scan showed inflammatory tubular structure within the central pelvis measuring 3.8 cm with presence of edema and inflammatory changes.  Patient also with  loculated pelvic fluid posterior to the uterus exerting mass-effect on the rectum.  Patient was admitted for pelvic inflammatory process and treated with IV antibiotics.  Patient was also found to have elevated liver enzymes and further lab work was done in evaluation.  Patient with negative acute hepatitis panel.  Patient also had STI testing and was negative for gonorrhea, chlamydia, syphilis and HIV.  CBC on day of discharge showed white blood cell count of 14.5 as well as anemia with hemoglobin of 10.9 and normal MCV.      Patient reports that her abdominal pain has completely resolved.  Patient did have some mild constipation but took a laxative yesterday and now feels better.  Patient is taking her antibiotics, metronidazole and doxycycline.  Patient feels that 1 of the medications is causing her to feel nauseous but she does have medication at home to help with nausea if needed.  She denies any fever or chills.  She reports that in the past she had a similar episode of abdominal pain when she was living in the [place] area and states that she  was told that they could not find the source of her pain and patient wonders if she has had presence of infection since that time.  Patient does have upcoming appointment with GYN and patient is unsure if they will just remove the infected fallopian tube or if she should request that everything be removed due to concerns about future infections.  Patient reports no current issues with fever or chills.  Patient has been able to tolerate a regular diet.  She reports that she has noticed some enlargement in her mid neck/thyroid area and states that prior to moving to this area she was supposed to have had follow-up but this was not done.  She denies any difficulty with swallowing and no shortness of breath or sensation of being short of breath when she is lying down. She denies any past issues with her liver enzymes and no prior issues with her blood sugars/diabetes.  Patient Active Problem List   Diagnosis Date Noted  . Abdominal pain 06/17/2018  . Transaminitis 06/17/2018  . Urinary retention 06/17/2018  . Abnormal uterine bleeding (AUB) 06/17/2018  . Constipation 06/17/2018  . 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 TOA (tubo-ovarian abscess) 06/16/2018     Current Outpatient Medications on File Prior to Visit  Medication Sig Dispense Refill  . acetaminophen (TYLENOL) 500 MG tablet Take 2 tablets (1,000 mg total) by mouth every 6 (six) hours as needed for fever or headache. 30 tablet 0  . bisacodyl (FLEET) 10 MG/30ML ENEM Place 10 mg rectally daily as needed (Constipation).    . Bisacodyl (LAXATIVE PO)  Take 1 tablet by mouth daily as needed (Constipation).    Marland Kitchen doxycycline (VIBRA-TABS) 100 MG tablet Take 1 tablet (100 mg total) by mouth every 12 (twelve) hours. 28 tablet 0  . ibuprofen (ADVIL) 600 MG tablet Take 1 tablet (600 mg total) by mouth every 6 (six) hours. 30 tablet 0  . metroNIDAZOLE (FLAGYL) 500 MG tablet Take 1 tablet (500 mg total) by mouth every 12 (twelve) hours. 28 tablet 0  . Multiple Vitamin  (MULTIVITAMIN WITH MINERALS) TABS tablet Take 1 tablet by mouth daily.    . ondansetron (ZOFRAN-ODT) 4 MG disintegrating tablet Take 1 tablet (4 mg total) by mouth every 8 (eight) hours as needed for nausea or vomiting. 20 tablet 0  . oxyCODONE (OXY IR/ROXICODONE) 5 MG immediate release tablet Take 1-2 tablets (5-10 mg total) by mouth every 6 (six) hours as needed for severe pain. 15 tablet 0   No current facility-administered medications on file prior to visit.     Social History   Tobacco Use  . Smoking status: Current Every Day Smoker    Packs/day: 1.00    Years: 16.00    Pack years: 16.00    Types: Cigarettes  . Smokeless tobacco: Never Used  Substance Use Topics  . Alcohol use: Yes    Comment: SOCIAL  . Drug use: Yes    Types: Marijuana     Family History  Problem Relation Age of Onset  . Heart failure Father   . Hypertension Father   . Hypertension Sister     Past Surgical History:  Procedure Laterality Date  . LAPAROSCOPIC APPENDECTOMY  2015   turned out to be PID/TOA.     ROS: Review of Systems  Constitutional: Positive for fatigue (mild). Negative for chills and fever.  HENT: Negative for sore throat and trouble swallowing.   Respiratory: Negative for cough and shortness of breath.   Cardiovascular: Negative for chest pain and palpitations.  Gastrointestinal: Positive for constipation (relieved with use of otc laxative that she took yesterday) and nausea (due to antibiotic use). Negative for abdominal pain, diarrhea and vomiting.  Endocrine: Negative for polydipsia, polyphagia and polyuria.  Genitourinary: Negative for dysuria and frequency.  Musculoskeletal: Negative for arthralgias and back pain.  Allergic/Immunologic: Negative for environmental allergies and immunocompromised state.  Neurological: Negative for dizziness and headaches.  Hematological: Negative for adenopathy. Does not bruise/bleed easily.     PHYSICAL EXAM: BP 124/89 (BP Location: Right  Arm, Patient Position: Sitting, Cuff Size: Large)   Pulse 72   Temp 98.2 F (36.8 C) (Oral)   Ht 5\' 5"  (1.651 m)   Wt 220 lb 6.4 oz (100 kg)   LMP 06/17/2018   BMI 36.68 kg/m    Physical Exam Vitals signs and nursing note reviewed.  Constitutional:      Appearance: Normal appearance. She is obese.  Neck:     Musculoskeletal: Normal range of motion and neck supple. No muscular tenderness.     Vascular: No carotid bruit.     Comments: Presence of thyromegaly with asymmetrical enlargement with right lower thyroid being bigger with possible nodule in this area Cardiovascular:     Rate and Rhythm: Normal rate and regular rhythm.  Pulmonary:     Effort: Pulmonary effort is normal.     Breath sounds: Normal breath sounds.  Abdominal:     Palpations: Abdomen is soft. There is no mass.     Tenderness: There is no abdominal tenderness. There is no right CVA tenderness, left  CVA tenderness, guarding or rebound.     Comments: Truncal obesity  Musculoskeletal:        General: No swelling or tenderness.     Right lower leg: No edema.     Left lower leg: No edema.  Lymphadenopathy:     Cervical: No cervical adenopathy.  Skin:    General: Skin is warm and dry.  Neurological:     General: No focal deficit present.     Mental Status: She is alert and oriented to person, place, and time.  Psychiatric:        Mood and Affect: Mood normal.        Behavior: Behavior normal.        Thought Content: Thought content normal.        Judgment: Judgment normal.    BP 124/89 (BP Location: Right Arm, Patient Position: Sitting, Cuff Size: Large)   Pulse 72   Temp 98.2 F (36.8 C) (Oral)   Ht 5\' 5"  (1.651 m)   Wt 220 lb 6.4 oz (100 kg)   LMP 06/17/2018   BMI 36.68 kg/m    ASSESSMENT AND PLAN: 1. TOA (tubo-ovarian abscess)/6. Hospital discharge follow-up Patient is s/p hospitalization from 06/16/18-06/19/18 for treatment of tubo-ovarian abscess along with issues with urinary retention upon  presentation.  Patient reports that her abdominal pain has resolved and she is not having a fever or chills.  She denies any current urinary issues.  Patient is being compliant with her antibiotics prescribed at the time of discharge.  Patient does have upcoming OB/GYN appointment to further discuss further management/treatment status post tubo-ovarian abscess.  Patient will have CBC as her white blood cell count was still elevated at the time of discharge as well as patient having anemia.  Will obtain CBC as patient did have mild elevation in glucose during her hospitalization as well as mild increase in liver enzymes.  Patient is new to the practice and she will be referred to social work for resources offered by this clinic. - CBC with Differential - Comprehensive metabolic panel - Ambulatory referral to Social Work  2. Transaminitis Patient had a mild increase in her liver enzymes with AST of 42 and ALT of 51 on 06/16/2018 during recent hospitalization and slight rise was thought to be related to her illness.  Patient will have CMP done in follow-up to make sure the liver enzymes have now returned to normal. Treatment recommendations will be made based on her results.  - Comprehensive metabolic panel  3. Normocytic anemia Patient with Hgb of 10.8 on 06/17/2018 with normal MCV of 88. Will check CBC at today's visit in follow-up of her anemia and she will be made aware of the need for further follow-up/treatment based on her results.  - CBC with Differential  4. Thyromegaly Patient with thyromegaly on exam and patient will be scheduled for US of the thyroid to look for goiter, nodules which may require further evaluation. T4 and TSH to assess thyroid function. - T4 AND TSH - US THYROID; Future  5. Elevated blood sugar level Patient with a mild increase in glucose at 108 on CMP during her recent hospitalization and Hgb A1c will be obtained in follow-up and patient will be made aware of the need for  further interventions/treatments based on the results. Healthy diet and regular exercise with goal of weight loss encouraged.  - HgB A1c   An After Visit Summary was printed and given to the patient.  Future Appointments  Date  Time Provider [place]  07/02/2018  2:30 PM [facility] [place] 3 [facility]  07/05/2018  8:55 AM [name], MD [facility]  08/08/2018 11:10 AM [name], MD [facility] None    [name], MD, FACP

## 2018-06-25 NOTE — Telephone Encounter (Signed)
Called patient to inform her with Ultrasound appointment information. Was not able to reach and staff LMOM.  Patient Ultrasound is scheduled for May 11th arriving at 2:10pm at [facility] at [address]. Was informed to let patient know to wear a mask she she comes to appointment and to also let patient know she can not have anyone with her due to Covid-19. [facility] also said due to patient not having insurance, if patient do not pay upfront and they have to bill her, the price will be $311.00 and if patient pay upfront, her price will be $186.60. [facility] wants to make sure patient is aware of the price before she gets to appointment.

## 2018-06-25 NOTE — Patient Instructions (Signed)
Please keep your follow-up visit with OB/GYN, Dr. Clovia Cuff on 07/05/2018 at 8:55 am  You will be contacted by Christa See, medical social worker for information about resources offered at this office

## 2018-06-26 ENCOUNTER — Telehealth: Payer: Self-pay | Admitting: *Deleted

## 2018-06-26 LAB — COMPREHENSIVE METABOLIC PANEL WITH GFR
ALT: 11 IU/L (ref 0–32)
AST: 11 IU/L (ref 0–40)
Albumin/Globulin Ratio: 1.2 (ref 1.2–2.2)
Albumin: 4.1 g/dL (ref 3.8–4.8)
Alkaline Phosphatase: 72 IU/L (ref 39–117)
BUN/Creatinine Ratio: 12 (ref 9–23)
BUN: 8 mg/dL (ref 6–20)
Bilirubin Total: <0.2 mg/dL (ref 0.0–1.2)
CO2: 25 mmol/L (ref 20–29)
Calcium: 10 mg/dL (ref 8.7–10.2)
Chloride: 101 mmol/L (ref 96–106)
Creatinine, Ser: 0.67 mg/dL (ref 0.57–1.00)
GFR calc Af Amer: 129 mL/min/1.73
GFR calc non Af Amer: 112 mL/min/1.73
Globulin, Total: 3.3 g/dL (ref 1.5–4.5)
Glucose: 97 mg/dL (ref 65–99)
Potassium: 5.3 mmol/L — ABNORMAL HIGH (ref 3.5–5.2)
Sodium: 141 mmol/L (ref 134–144)
Total Protein: 7.4 g/dL (ref 6.0–8.5)

## 2018-06-26 LAB — CBC WITH DIFFERENTIAL/PLATELET
Basophils Absolute: 0 x10E3/uL (ref 0.0–0.2)
Basos: 0 %
EOS (ABSOLUTE): 0.1 x10E3/uL (ref 0.0–0.4)
Eos: 1 %
Hematocrit: 35 % (ref 34.0–46.6)
Hemoglobin: 12 g/dL (ref 11.1–15.9)
Immature Grans (Abs): 0.1 x10E3/uL (ref 0.0–0.1)
Immature Granulocytes: 1 %
Lymphocytes Absolute: 2.3 x10E3/uL (ref 0.7–3.1)
Lymphs: 21 %
MCH: 28 pg (ref 26.6–33.0)
MCHC: 34.3 g/dL (ref 31.5–35.7)
MCV: 82 fL (ref 79–97)
Monocytes Absolute: 0.7 x10E3/uL (ref 0.1–0.9)
Monocytes: 7 %
Neutrophils Absolute: 7.6 x10E3/uL — ABNORMAL HIGH (ref 1.4–7.0)
Neutrophils: 70 %
Platelets: 647 x10E3/uL — ABNORMAL HIGH (ref 150–450)
RBC: 4.28 x10E6/uL (ref 3.77–5.28)
RDW: 13.2 % (ref 11.7–15.4)
WBC: 10.8 x10E3/uL (ref 3.4–10.8)

## 2018-06-26 LAB — T4 AND TSH
T4, Total: 9.6 ug/dL (ref 4.5–12.0)
TSH: 0.433 u[IU]/mL — ABNORMAL LOW (ref 0.450–4.500)

## 2018-06-26 NOTE — Telephone Encounter (Signed)
Called patient to make sure she received voicemail about call office for Ultrasound appt. Spoke with patient and she stated she did call office and she is aware of her appt for her procedure information.

## 2018-06-29 ENCOUNTER — Telehealth: Payer: Self-pay | Admitting: *Deleted

## 2018-06-29 NOTE — Telephone Encounter (Signed)
She should let the plasma center know about her platelet count. I am not sure if the increase in her platelets is related to her frequent donations

## 2018-06-29 NOTE — Telephone Encounter (Signed)
While informing patient of her lab results, patient stated that she donates plasma twice a week. Should she continue or stop due to platelet count.  Please advise.

## 2018-07-02 ENCOUNTER — Ambulatory Visit
Admission: RE | Admit: 2018-07-02 | Discharge: 2018-07-02 | Disposition: A | Discharge status: Home / Self Care | Payer: Self-pay | Source: Ambulatory Visit | Attending: Family Medicine | Admitting: Family Medicine

## 2018-07-02 DIAGNOSIS — E010 Iodine-deficiency related diffuse (endemic) goiter: Secondary | ICD-10-CM

## 2018-07-02 NOTE — Telephone Encounter (Signed)
Pt informed of message. She was in the process of donating platelets. Would she benefit from a hematology?

## 2018-07-03 ENCOUNTER — Other Ambulatory Visit: Payer: Self-pay | Admitting: Family Medicine

## 2018-07-03 DIAGNOSIS — E042 Nontoxic multinodular goiter: Secondary | ICD-10-CM

## 2018-07-03 NOTE — Telephone Encounter (Signed)
I wanted to repeat her platelets at her follow-up appointment and if still high then refer her to hematology

## 2018-07-03 NOTE — Progress Notes (Signed)
Patient ID: Cheryl Wiley, female   DOB: 10/13/1980, 38 y.o.   MRN: [id number]   Patient with multiple thyroid nodules on [place] of thyroid and three of which meet criteria for biopsy. Patient will be referred to IR for fine needle biopsy of nodules and ENT referral

## 2018-07-04 NOTE — Telephone Encounter (Signed)
Patient aware.

## 2018-07-05 ENCOUNTER — Other Ambulatory Visit: Payer: Self-pay

## 2018-07-05 ENCOUNTER — Ambulatory Visit (INDEPENDENT_AMBULATORY_CARE_PROVIDER_SITE_OTHER): Payer: Self-pay | Admitting: Obstetrics & Gynecology

## 2018-07-05 DIAGNOSIS — N7093 Salpingitis and oophoritis, unspecified: Secondary | ICD-10-CM

## 2018-07-05 NOTE — Progress Notes (Signed)
I connected with  Cheryl Wiley on 07/05/18 at  8:55 AM EDT by telephone and verified that I am speaking with the correct person using two identifiers.   I discussed the limitations, risks, security and privacy concerns of performing an evaluation and management service by telephone and the availability of in person appointments. I also discussed with the patient that there may be a patient responsible charge related to this service. The patient expressed understanding and agreed to proceed.  [place], [place] 07/05/2018  8:49 AM

## 2018-07-05 NOTE — Progress Notes (Signed)
TELEHEALTH VIRTUAL GYNECOLOGY VISIT ENCOUNTER NOTE  I connected with Cheryl Wiley on 07/05/18 at  8:55 AM EDT by telephone at home and verified that I am speaking with the correct person using two identifiers.   I discussed the limitations, risks, security and privacy concerns of performing an evaluation and management service by telephone and the availability of in person appointments. I also discussed with the patient that there may be a patient responsible charge related to this service. The patient expressed understanding and agreed to proceed.   History:  Cheryl Wiley is a separated  38 y.o. G0P0000 female being evaluated today for follow up after hospital admission last month for presumed PID. She has taken 2 weeks more of flagyl and doxy. She feels much better. She says that she had a similar event 5 years ago in [place]. She had a laparoscopy for this along with IV abx.  She had another laparoscopy in 2017 for the same issue. She still has some nausea. She thinks that the oxycodone works better than the IBU (which hurts her stomach). She denies any abnormal vaginal discharge, bleeding, pelvic pain or other concerns.    She has been abstinent for about 3 months.    Past Medical History:  Diagnosis Date   PID (acute pelvic inflammatory disease)    TOA (tubo-ovarian abscess) 05/2018   Past Surgical History:  Procedure Laterality Date   LAPAROSCOPIC APPENDECTOMY  2015   turned out to be PID/TOA.    The following portions of the patient's history were reviewed and updated as appropriate: allergies, current medications, past family history, past medical history, past social history, past surgical history and problem list.     Review of Systems:  Pertinent items noted in HPI and remainder of comprehensive ROS otherwise negative. She reports that [place]. She says that it was normal.  She uses street pain meds prn  Physical Exam:    General:  Alert, oriented and cooperative.   Mental Status: Normal mood and affect perceived. Normal judgment and thought content.  Physical exam deferred due to nature of the encounter  Labs and Imaging Results for orders placed or performed in visit on 06/25/18 (from the past 336 hour(s))  CBC with Differential   Collection Time: 06/25/18 12:04 PM  Result Value Ref Range   WBC 10.8 3.4 - 10.8 x10E3/uL   RBC 4.28 3.77 - 5.28 x10E6/uL   Hemoglobin 12.0 11.1 - 15.9 g/dL   Hematocrit 35.0 34.0 - 46.6 %   MCV 82 79 - 97 fL   MCH 28.0 26.6 - 33.0 pg   MCHC 34.3 31.5 - 35.7 g/dL   RDW 13.2 11.7 - 15.4 %   Platelets 647 (H) 150 - 450 x10E3/uL   Neutrophils 70 Not Estab. %   Lymphs 21 Not Estab. %   Monocytes 7 Not Estab. %   Eos 1 Not Estab. %   Basos 0 Not Estab. %   Neutrophils Absolute 7.6 (H) 1.4 - 7.0 x10E3/uL   Lymphocytes Absolute 2.3 0.7 - 3.1 x10E3/uL   Monocytes Absolute 0.7 0.1 - 0.9 x10E3/uL   EOS (ABSOLUTE) 0.1 0.0 - 0.4 x10E3/uL   Basophils Absolute 0.0 0.0 - 0.2 x10E3/uL   Immature Granulocytes 1 Not Estab. %   Immature Grans (Abs) 0.1 0.0 - 0.1 x10E3/uL  Comprehensive metabolic panel   Collection Time: 06/25/18 12:04 PM  Result Value Ref Range   Glucose 97 65 - 99 mg/dL  BUN 8 6 - 20 mg/dL   Creatinine, Ser 0.67 0.57 - 1.00 mg/dL   GFR calc non Af Amer 112 >59 mL/min/1.73   GFR calc Af Amer 129 >59 mL/min/1.73   BUN/Creatinine Ratio 12 9 - 23   Sodium 141 134 - 144 mmol/L   Potassium 5.3 (H) 3.5 - 5.2 mmol/L   Chloride 101 96 - 106 mmol/L   CO2 25 20 - 29 mmol/L   Calcium 10.0 8.7 - 10.2 mg/dL   Total Protein 7.4 6.0 - 8.5 g/dL   Albumin 4.1 3.8 - 4.8 g/dL   Globulin, Total 3.3 1.5 - 4.5 g/dL   Albumin/Globulin Ratio 1.2 1.2 - 2.2   Bilirubin Total <0.2 0.0 - 1.2 mg/dL   Alkaline Phosphatase 72 39 - 117 IU/L   AST 11 0 - 40 IU/L   ALT 11 0 - 32 IU/L  T4 AND TSH   Collection Time: 06/25/18 12:04 PM  Result Value Ref Range   TSH 0.433 (L) 0.450 -  4.500 uIU/mL   T4, Total 9.6 4.5 - 12.0 ug/dL  HgB A1c   Collection Time: 06/25/18  1:55 PM  Result Value Ref Range   Hemoglobin A1C 5.9 (A) 4.0 - 5.6 %   HbA1c POC (<> result, manual entry)     HbA1c, POC (prediabetic range)     HbA1c, POC (controlled diabetic range)     Ct Abdomen Pelvis W Contrast  Result Date: 06/16/2018 CLINICAL DATA:  Abdominal pain EXAM: CT ABDOMEN AND PELVIS WITH CONTRAST TECHNIQUE: Multidetector CT imaging of the abdomen and pelvis was performed using the standard protocol following bolus administration of intravenous contrast. CONTRAST:  175mL OMNIPAQUE IOHEXOL 300 MG/ML  SOLN COMPARISON:  None. FINDINGS: Lower chest: No acute abnormality. Hepatobiliary: No focal liver abnormality is seen. No gallstones, gallbladder wall thickening, or biliary dilatation. Pancreas: Unremarkable. No pancreatic ductal dilatation or surrounding inflammatory changes. Spleen: Normal in size without focal abnormality. Adrenals/Urinary Tract: Adrenal glands are unremarkable. Kidneys are normal, without renal calculi, focal lesion, or hydronephrosis. Bladder is unremarkable. Stomach/Bowel: Stomach is nonenlarged. No dilated small bowel. Patient appears to be status post appendectomy. Vascular/Lymphatic: No significant vascular findings are present. No enlarged abdominal or pelvic lymph nodes. Reproductive: Uterus is unremarkable. Other: Inflammatory tubular structure within the central pelvis measuring 3.8 cm. Edema and inflammatory change is present. No free air. Loculated pelvic fluid posterior to the uterus and exerting mass effect on the rectum. Musculoskeletal: Degenerative changes at L5-S1. IMPRESSION: 1. Pelvic inflammatory process. Thick-walled tubular structure within the central pelvis with surrounding inflammatory change, somewhat difficult to separate from adjacent unopacified small bowel loops. Differential considerations include tubo-ovarian abscess/pyosalpinx versus abnormally thickened  loop of small bowel as may be seen with infectious or inflammatory enteritis. 2. Moderate loculated fluid in the pelvis which is exerting mass effect on the rectum. Electronically Signed   By: Donavan Foil M.D.   On: 06/16/2018 21:36   US Thyroid  Result Date: 07/02/2018 CLINICAL DATA:  38 year old female with thyroid enlargement EXAM: THYROID ULTRASOUND TECHNIQUE: Ultrasound examination of the thyroid gland and adjacent soft tissues was performed. COMPARISON:  None. FINDINGS: Parenchymal Echotexture: Mildly heterogenous Isthmus: 0.5 cm Right lobe: 7.0 cm x 2.5 cm x 2.6 cm Left lobe: 6.9 cm x 2.3 cm x 3.0 cm _________________________________________________________ Estimated total number of nodules >/= 1 cm: 4 Number of spongiform nodules >/=  2 cm not described below (TR1): 0 Number of mixed cystic and solid nodules >/= 1.5 cm not described below (  [place]): 0 _________________________________________________________ Nodule # 1: Location: Right; Inferior Maximum size: 3.9 cm; Other 2 dimensions: 2.3 cm x 2.1 cm Composition: solid/almost completely solid (2) Echogenicity: isoechoic (1) Shape: not taller-than-wide (0) Margins: ill-defined (0) Echogenic foci: none (0) ACR TI-RADS total points: 3. ACR TI-RADS risk category: TR3 (3 points). ACR TI-RADS recommendations: Nodule meets criteria for biopsy _________________________________________________________ Nodule # 2: Location: Left; Mid Maximum size: 0.8 cm; Other 2 dimensions: 0.5 cm x 0.5 cm Composition: mixed cystic and solid (1) Echogenicity: isoechoic (1) Shape: not taller-than-wide (0) Margins: ill-defined (0) Echogenic foci: none (0) ACR TI-RADS total points: 0. ACR TI-RADS risk category: TR2 (2 points). ACR TI-RADS recommendations: Nodule does not meet criteria for surveillance or biopsy _________________________________________________________ Nodule # 3: Location: Left; Mid Maximum size: 1.2 cm; Other 2 dimensions: 0.8 cm x 0.6 cm Composition: solid/almost  completely solid (2) Echogenicity: isoechoic (1) Shape: not taller-than-wide (0) Margins: smooth (0) Echogenic foci: none (0) ACR TI-RADS total points: 3. ACR TI-RADS risk category: TR3 (3 points). ACR TI-RADS recommendations: Nodule does not meet criteria for surveillance or biopsy _________________________________________________________ Nodule # 4: Location: Left; Inferior Maximum size: 2.5 cm; Other 2 dimensions: 1.4 cm x 1.1 cm Composition: solid/almost completely solid (2) Echogenicity: isoechoic (1) Shape: taller-than-wide (3) Margins: ill-defined (0) Echogenic foci: none (0) ACR TI-RADS total points: 6. ACR TI-RADS risk category: TR4 (4-6 points). ACR TI-RADS recommendations: Nodule meets criteria for biopsy _________________________________________________________ Nodule # 5: Location: Left; Inferior Maximum size: 2.7 cm; Other 2 dimensions: 2.0 cm x 1.4 cm Composition: solid/almost completely solid (2) Echogenicity: isoechoic (1) Shape: not taller-than-wide (0) Margins: ill-defined (0) Echogenic foci: punctate echogenic foci (3) ACR TI-RADS total points: 6. ACR TI-RADS risk category: TR4 (4-6 points). ACR TI-RADS recommendations: Nodule meets criteria for biopsy _________________________________________________________ No adenopathy IMPRESSION: Adjacent left inferior thyroid nodules (labeled 4 and 5) both meet criteria for biopsy, as designated by the newly established ACR TI-RADS criteria, and referral for biopsy is recommended. Recommendations follow those established by the new ACR TI-RADS criteria (J Am Coll Radiol 1610;96:045-409). Electronically Signed   By: Corrie Mckusick D.O.   On: 07/02/2018 15:06      Assessment and Plan:     H/o recurrent PID. She does not want kids. She is tired of dealing with the recurrent pain, IV abx and surgery and is considering definitive treatment of TAH/BS (possible BSO). At the first sign of a return of this situation, she should come into the office for  evaluation/treatment.  In the meantime, she will fill out financial aid forms. Gyn u/s ordered      I discussed the assessment and treatment plan with the patient. The patient was provided an opportunity to ask questions and all were answered. The patient agreed with the plan and demonstrated an understanding of the instructions.   The patient was advised to call back or seek an in-person evaluation/go to the ED if the symptoms worsen or if the condition fails to improve as anticipated.  I provided 15 minutes of non-face-to-face time during this encounter.   [name], MD [facility], [place]

## 2018-07-06 ENCOUNTER — Other Ambulatory Visit: Payer: Self-pay | Admitting: Obstetrics & Gynecology

## 2018-07-06 MED ORDER — IBUPROFEN 600 MG PO TABS: 600.0000 mg | ORAL_TABLET | Freq: Four times a day (QID) | ORAL | 1 refills | Status: DC | PRN

## 2018-07-06 NOTE — Progress Notes (Signed)
ibu reordered I spoke with her yesterday and declined to refill any more narcotics.

## 2018-07-26 ENCOUNTER — Other Ambulatory Visit: Payer: Self-pay | Admitting: Family Medicine

## 2018-07-26 DIAGNOSIS — E042 Nontoxic multinodular goiter: Secondary | ICD-10-CM

## 2018-07-31 ENCOUNTER — Ambulatory Visit
Admission: RE | Admit: 2018-07-31 | Discharge: 2018-07-31 | Disposition: A | Discharge status: Home / Self Care | Payer: Self-pay | Source: Ambulatory Visit | Attending: Family Medicine | Admitting: Family Medicine

## 2018-07-31 ENCOUNTER — Other Ambulatory Visit (HOSPITAL_COMMUNITY)
Admission: RE | Admit: 2018-07-31 | Discharge: 2018-07-31 | Disposition: A | Discharge status: Home / Self Care | Payer: Self-pay | Source: Ambulatory Visit | Attending: Student | Admitting: Student

## 2018-07-31 DIAGNOSIS — E042 Nontoxic multinodular goiter: Secondary | ICD-10-CM

## 2018-08-01 ENCOUNTER — Other Ambulatory Visit: Payer: Self-pay | Admitting: Family Medicine

## 2018-08-01 DIAGNOSIS — E042 Nontoxic multinodular goiter: Secondary | ICD-10-CM

## 2018-08-01 NOTE — Progress Notes (Signed)
Patient ID: Cheryl Wiley, female   DOB: 1980-07-07, 38 y.o.   MRN: [id number]   Patient is s/p FNA biopsy but unfortunately FNA of  the nodule in the right thyroid was unable to be performed. Patient will be referred to ENT for further evaluation and treatment

## 2018-08-08 ENCOUNTER — Ambulatory Visit: Payer: Self-pay | Attending: Family Medicine | Admitting: Family Medicine

## 2018-08-08 ENCOUNTER — Encounter: Payer: Self-pay | Admitting: Family Medicine

## 2018-08-08 ENCOUNTER — Other Ambulatory Visit: Payer: Self-pay

## 2018-08-08 DIAGNOSIS — N7093 Salpingitis and oophoritis, unspecified: Secondary | ICD-10-CM

## 2018-08-08 DIAGNOSIS — R1030 Lower abdominal pain, unspecified: Secondary | ICD-10-CM

## 2018-08-08 DIAGNOSIS — D75839 Thrombocytosis, unspecified: Secondary | ICD-10-CM

## 2018-08-08 DIAGNOSIS — D473 Essential (hemorrhagic) thrombocythemia: Secondary | ICD-10-CM

## 2018-08-08 DIAGNOSIS — R7303 Prediabetes: Secondary | ICD-10-CM

## 2018-08-08 DIAGNOSIS — E042 Nontoxic multinodular goiter: Secondary | ICD-10-CM

## 2018-08-08 MED ORDER — ACETAMINOPHEN-CODEINE #3 300-30 MG PO TABS: 1.0000 | ORAL_TABLET | Freq: Four times a day (QID) | ORAL | 0 refills | Status: AC | PRN

## 2018-08-08 NOTE — Progress Notes (Signed)
Virtual Visit via Telephone Note  I connected with Cheryl Wiley on 08/08/18 at 11:10 AM EDT by telephone and verified that I am speaking with the correct person using two identifiers.   I discussed the limitations, risks, security and privacy concerns of performing an evaluation and management service by telephone and the availability of in person appointments. I also discussed with the patient that there may be a patient responsible charge related to this service. The patient expressed understanding and agreed to proceed.  Patient Location: [facility], operational Location: Office Others participating in call:    History of Present Illness:      38 year old female seen in follow-up of recent new patient visit to establish care after hospitalization for tubo-ovarian abscess.  She reports that she did keep her follow-up appointment with GYN.  Patient states that she is not sure that she wants to have a hysterectomy and removal of her fallopian tubes in case she does decide that she wants children in the future.  She reports that she does still occasionally get sharp pain in her lower abdomen below the bellybutton and sometimes on the sides of the lower abdomen as she would like a prescription for oxycodone 5 mg as she states that this medication helps with her pain and does not cause her to have nausea and stomach upset like she does with the use of tramadol.      She also reports that she did receive a call regarding the pathology on her thyroid nodules and she was told that they were benign, noncancerous.  She denies any difficulty with swallowing, shortness of breath or sore throat related to her enlarged thyroid with multiple nodules.  She reports that she did receive the lab results from her last visit as she believes that her platelet count is increased because she donates plasma.       She is also aware of elevated blood sugar/hemoglobin A1c indicating prediabetes.  She denies any  increased thirst, no urinary frequency and no blurred vision.  She denies any current issues with vaginal discharge or pelvic pain other than the recurrent lower abdominal pain that she gets about once every 2 to 3 weeks.  She states that she just wants to have some medicine that she can have at home in her medicine cabinet in case she has onset of severe pain.  She also states that ibuprofen does not help her pain and it "eats her stomach up from the inside". She is not currently working as she states that she was laid off due to the COVID-19 pandemic.  She denies any fever or chills, no current nausea or vomiting.  She denies any current muscle or joint pain and no swelling in her legs or other parts of her body.  She states that overall she feels well but really would like to have pain medication in case she has abdominal pain.  Patient states that she dropped off her past medical records from [facility] and [facility].  She states that she does not believe that she was critically diagnosed at [facility] as she states that they removed her appendix but told her that the appendix was actually normal but she did not really need it anyway.  She states that it was only when she went to [facility] and then her recent hospitalization at [facility] that she was told that she had issues with pelvic inflammatory disease and tubo-ovarian abscess.Marland Kitchen  Past  Medical History:  Diagnosis Date  . PID (acute pelvic inflammatory disease)   . TOA (tubo-ovarian abscess) 05/2018    Past Surgical History:  Procedure Laterality Date  . LAPAROSCOPIC APPENDECTOMY  2015   turned out to be PID/TOA.     Family History  Problem Relation Age of Onset  . Heart failure Father   . Hypertension Father   . Hypertension Sister     Social History   Tobacco Use  . Smoking status: Current Every Day Smoker    Packs/day: 1.00    Years: 16.00    Pack years: 16.00    Types: Cigarettes    . Smokeless tobacco: Never Used  Substance Use Topics  . Alcohol use: Yes    Comment: SOCIAL  . Drug use: Yes    Types: Marijuana     No Known Allergies     Observations/Objective: No vital signs or physical exam conducted as visit was done via telephone  Assessment and Plan: 1. TOA (tubo-ovarian abscess) Patient reports history of recurrent abdominal pain for years and states that it was only until her recent hospitalization that she actually knew the cause of her pain.  She has seen GYN in follow-up of her tubo-ovarian abscess.  She is undecided whether or not to proceed with hysterectomy and removal of the fallopian tubes.  Patient recurrently requested narcotic pain medication and when she was made aware that this clinic will only prescribe tramadol or Tylenol 3, she requested to try the Tylenol 3.  Patient was also advised that if she has abdominal pain that lasts for more than 1 day that she needs to seek medical attention to make sure that there is not another cause of her pain such as recurrence of PID or tubo-ovarian abscess.  Also discussed with the patient that if she does not wish to have hysterectomy and she continues to have abdominal pain that she may want to discuss exploratory surgery to look for adhesions or endometriosis that she has also had history of prior surgery at which time appendectomy was done.  She is also encouraged to make follow-up appointment with GYN for further treatment discussion - acetaminophen-codeine (TYLENOL #3) 300-30 MG tablet; Take 1-2 tablets by mouth every 6 (six) hours as needed for up to 5 days for moderate pain.  Dispense: 30 tablet; Refill: 0  2. Lower abdominal pain Patient with recurrent request for narcotic pain medication.  Prescription will be sent to this pharmacy for Tylenol 3 and she may take 1-2 every 6 hours for acute pain but if she is having pain more than 1 day that she needs to seek medical attention for further evaluation. -  acetaminophen-codeine (TYLENOL #3) 300-30 MG tablet; Take 1-2 tablets by mouth every 6 (six) hours as needed for up to 5 days for moderate pain.  Dispense: 30 tablet; Refill: 0  3. Thrombocytosis ([place]) Patient with elevated platelet count on her most recent CBC which may have been reactive secondary to her 2-week antibiotic therapy for tubo-ovarian abscess as well as her illness.  Patient believes that this is related to her plasma donations and she does not wish to have repeat CBC at this time.  Patient was asked to schedule appointment in 4 months and she will have blood work done at that time in follow-up of thrombocytosis as well as prediabetes and abdominal pain.  4. Multiple thyroid nodules Patient had ultrasound showing multiple thyroid nodules and she had fine-needle biopsy by interventional radiology.  She reports  that she was contacted and made aware of that the pathology report showed benign findings.  Patient will likely need follow-up thyroid ultrasound in a year to see if any of the other nodules have now met size criteria for biopsy  5. Pre-diabetes Patient with hemoglobin A1c of 5.9 on most recent blood work done on 06/25/2018 which is consistent with prediabetes.  Low carbohydrate diet as well as exercise and weight loss encouraged and patient will have a repeat hemoglobin A1c at her 75-monthfollow-up.  Follow Up Instructions:Return in about 4 months (around 12/08/2018), or if symptoms worsen or fail to improve, for Prediabetes/thrombocytosis/abdominal pain.    I discussed the assessment and treatment plan with the patient. The patient was provided an opportunity to ask questions and all were answered. The patient agreed with the plan and demonstrated an understanding of the instructions.   The patient was advised to call back or seek an in-person evaluation if the symptoms worsen or if the condition fails to improve as anticipated.  I provided 16 minutes of non-face-to-face time  during this encounter.   CAntony Blackbird MD

## 2018-08-08 NOTE — Progress Notes (Signed)
Per pt she asked for pain med Per pt she can't take Ibuprofen, Per pt the Oxycodone works.   Per pt they took her Appendectomy out and she thinks it was suppose to be her Fallopian Tube.

## 2018-08-31 ENCOUNTER — Telehealth: Payer: Self-pay | Admitting: Licensed Clinical Social Worker

## 2018-08-31 NOTE — Telephone Encounter (Signed)
Call placed to follow up on resource needs. LCSW was unable to leave a message due to the following message: call could not be completed at this time.

## 2018-09-29 ENCOUNTER — Emergency Department (HOSPITAL_COMMUNITY): Payer: No Typology Code available for payment source

## 2018-09-29 ENCOUNTER — Other Ambulatory Visit: Payer: Self-pay

## 2018-09-29 ENCOUNTER — Emergency Department (HOSPITAL_COMMUNITY)
Admission: EM | Admit: 2018-09-29 | Discharge: 2018-09-29 | Disposition: A | Discharge status: Home / Self Care | Payer: No Typology Code available for payment source | Attending: Emergency Medicine | Admitting: Emergency Medicine

## 2018-09-29 DIAGNOSIS — S0081XA Abrasion of other part of head, initial encounter: Secondary | ICD-10-CM | POA: Diagnosis not present

## 2018-09-29 DIAGNOSIS — S0990XA Unspecified injury of head, initial encounter: Secondary | ICD-10-CM | POA: Diagnosis present

## 2018-09-29 DIAGNOSIS — S0083XA Contusion of other part of head, initial encounter: Secondary | ICD-10-CM | POA: Diagnosis not present

## 2018-09-29 DIAGNOSIS — Y999 Unspecified external cause status: Secondary | ICD-10-CM | POA: Diagnosis not present

## 2018-09-29 DIAGNOSIS — Y9389 Activity, other specified: Secondary | ICD-10-CM | POA: Insufficient documentation

## 2018-09-29 DIAGNOSIS — S060X1A Concussion with loss of consciousness of 30 minutes or less, initial encounter: Secondary | ICD-10-CM | POA: Diagnosis not present

## 2018-09-29 DIAGNOSIS — Y92410 Unspecified street and highway as the place of occurrence of the external cause: Secondary | ICD-10-CM | POA: Diagnosis not present

## 2018-09-29 DIAGNOSIS — N83292 Other ovarian cyst, left side: Secondary | ICD-10-CM | POA: Insufficient documentation

## 2018-09-29 LAB — CBC WITH DIFFERENTIAL/PLATELET
Abs Immature Granulocytes: 0.01 10*3/uL (ref 0.00–0.07)
Basophils Absolute: 0 10*3/uL (ref 0.0–0.1)
Basophils Relative: 0 %
Eosinophils Absolute: 0.2 10*3/uL (ref 0.0–0.5)
Eosinophils Relative: 3 %
HCT: 38.6 % (ref 36.0–46.0)
Hemoglobin: 11.8 g/dL — ABNORMAL LOW (ref 12.0–15.0)
Immature Granulocytes: 0 %
Lymphocytes Relative: 39 %
Lymphs Abs: 2.3 10*3/uL (ref 0.7–4.0)
MCH: 27.3 pg (ref 26.0–34.0)
MCHC: 30.6 g/dL (ref 30.0–36.0)
MCV: 89.4 fL (ref 80.0–100.0)
Monocytes Absolute: 0.4 10*3/uL (ref 0.1–1.0)
Monocytes Relative: 7 %
Neutro Abs: 2.9 10*3/uL (ref 1.7–7.7)
Neutrophils Relative %: 51 %
Platelets: 369 10*3/uL (ref 150–400)
RBC: 4.32 MIL/uL (ref 3.87–5.11)
RDW: 14.1 % (ref 11.5–15.5)
WBC: 5.9 10*3/uL (ref 4.0–10.5)
nRBC: 0 % (ref 0.0–0.2)

## 2018-09-29 LAB — COMPREHENSIVE METABOLIC PANEL
ALT: 17 U/L (ref 0–44)
AST: 20 U/L (ref 15–41)
Albumin: 3.9 g/dL (ref 3.5–5.0)
Alkaline Phosphatase: 48 U/L (ref 38–126)
Anion gap: 10 (ref 5–15)
BUN: 7 mg/dL (ref 6–20)
CO2: 24 mmol/L (ref 22–32)
Calcium: 9.6 mg/dL (ref 8.9–10.3)
Chloride: 106 mmol/L (ref 98–111)
Creatinine, Ser: 0.76 mg/dL (ref 0.44–1.00)
GFR calc Af Amer: >60 mL/min (ref >60)
GFR calc non Af Amer: >60 mL/min (ref >60)
Glucose, Bld: 103 mg/dL — ABNORMAL HIGH (ref 70–99)
Potassium: 3.7 mmol/L (ref 3.5–5.1)
Sodium: 140 mmol/L (ref 135–145)
Total Bilirubin: 0.3 mg/dL (ref 0.3–1.2)
Total Protein: 7.4 g/dL (ref 6.5–8.1)

## 2018-09-29 LAB — I-STAT BETA HCG BLOOD, ED (MC, WL, AP ONLY): I-stat hCG, quantitative: <5 m[IU]/mL (ref <5)

## 2018-09-29 LAB — CBG MONITORING, ED: Glucose-Capillary: 97 mg/dL (ref 70–99)

## 2018-09-29 LAB — ETHANOL: Alcohol, Ethyl (B): <10 mg/dL (ref <10)

## 2018-09-29 MED ORDER — IOHEXOL 300 MG/ML  SOLN
100.0000 mL | Freq: Once | INTRAMUSCULAR | Status: AC | PRN
  Administered 2018-09-29: 100 mL via INTRAVENOUS

## 2018-09-29 MED ORDER — NALOXONE HCL 0.4 MG/ML IJ SOLN
INTRAMUSCULAR | Status: AC
  Filled 2018-09-29: qty 1
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
## 2018-09-29 NOTE — Discharge Instructions (Signed)
Take Tylenol and ibuprofen as needed for aches and pains.  Hot shower and muscle rubs may also be helpful.
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
## 2018-10-19 ENCOUNTER — Ambulatory Visit: Payer: Self-pay | Attending: Family Medicine | Admitting: Family Medicine

## 2018-10-19 ENCOUNTER — Encounter: Payer: Self-pay | Admitting: Family Medicine

## 2018-10-19 ENCOUNTER — Other Ambulatory Visit: Payer: Self-pay

## 2018-10-19 VITALS — BP 99/69 | HR 76 | Temp 99.4°F | Resp 18 | Ht 65.0 in | Wt 183.0 lb

## 2018-10-19 DIAGNOSIS — N7093 Salpingitis and oophoritis, unspecified: Secondary | ICD-10-CM

## 2018-10-19 DIAGNOSIS — N939 Abnormal uterine and vaginal bleeding, unspecified: Secondary | ICD-10-CM

## 2018-10-19 DIAGNOSIS — E042 Nontoxic multinodular goiter: Secondary | ICD-10-CM

## 2018-10-19 DIAGNOSIS — G4486 Cervicogenic headache: Secondary | ICD-10-CM

## 2018-10-19 DIAGNOSIS — R7303 Prediabetes: Secondary | ICD-10-CM

## 2018-10-19 DIAGNOSIS — M542 Cervicalgia: Secondary | ICD-10-CM

## 2018-10-19 DIAGNOSIS — R739 Hyperglycemia, unspecified: Secondary | ICD-10-CM

## 2018-10-19 DIAGNOSIS — Z09 Encounter for follow-up examination after completed treatment for conditions other than malignant neoplasm: Secondary | ICD-10-CM

## 2018-10-19 DIAGNOSIS — E010 Iodine-deficiency related diffuse (endemic) goiter: Secondary | ICD-10-CM

## 2018-10-19 DIAGNOSIS — R51 Headache: Secondary | ICD-10-CM

## 2018-10-19 LAB — POCT GLYCOSYLATED HEMOGLOBIN (HGB A1C): Hemoglobin A1C: 5.9 % — AB (ref 4.0–5.6)

## 2018-10-19 LAB — GLUCOSE, POCT (MANUAL RESULT ENTRY): POC Glucose: 109 mg/dL — AB (ref 70–99)

## 2018-10-19 MED ORDER — CYCLOBENZAPRINE HCL 10 MG PO TABS: 10.0000 mg | ORAL_TABLET | Freq: Every day | ORAL | 1 refills | Status: DC

## 2018-10-19 MED ORDER — IBUPROFEN 600 MG PO TABS: 600.0000 mg | ORAL_TABLET | Freq: Three times a day (TID) | ORAL | 1 refills | Status: DC | PRN

## 2018-10-19 MED FILL — IBUPROFEN 600 MG TABLET: 600 | 20 days supply | Qty: 60 | Fill #0

## 2018-10-19 MED FILL — CYCLOBENZAPRINE 10 MG TAB: 10 | 30 days supply | Qty: 30 | Fill #0

## 2018-10-19 NOTE — Patient Instructions (Signed)
Cervicogenic Headache  A cervicogenic headache is a headache caused by a condition that affects the bones and tissues in your neck (cervical spine). In a cervicogenic headache, the pain moves from your neck to your head. Most cervicogenic headaches start in the upper part of the neck with the first three cervical bones (cervical vertebrae). A cervicogenic headache is diagnosed when a cause can be found in the cervical spine and other causes of headaches can be ruled out. What are the causes? The most common cause of this condition is a traumatic injury to the cervical spine, such as whiplash. Other causes include:  Arthritis.  Broken bone (fracture).  Infection.  Tumor. What are the signs or symptoms? The most common symptoms are neck and head pain. The pain is often located on one side. In some cases, there may be head pain without neck pain. Pain may be felt in the neck, back or side of the head, face, or behind the eyes. Other symptoms include:  Limited movement in the neck.  Arm or shoulder pain. How is this diagnosed? This condition may be diagnosed based on:  Your symptoms.  A physical exam.  An injection that blocks nerve signals (nerve block).  Imaging tests, such as: ? X-rays. ? CT scan. ? MRI. How is this treated? Treatment for this condition may depend on the underlying condition. Treatment may include:  Medicines, such as: ? NSAIDs. ? Muscle relaxants.  Physical therapy.  Massage therapy.  Complementary therapies, such as: ? Biofeedback. ? Meditation. ? Acupuncture.  Nerve block injections.  Botulinum toxin injections. Your treatment plan may involve working with a pain management team that includes your primary health care provider, a pain management specialist, a neurologist, and a physical therapist. Follow these instructions at home:  Take over-the-counter and prescription medicines only as told by your health care provider.  Do exercises at  home as told by your physical therapist.  Return to your normal activities as told by your health care provider. Ask your health care provider what activities are safe for you. Avoid activities that trigger your headaches.  Maintain good neck support and posture at home and at work.  Keep all follow-up visits as told by your health care provider. This is important. Contact a health care provider if you have:  Headaches that are getting worse and happening more often.  Headaches with any of the following: ? Fever. ? Numbness. ? Weakness. ? Dizziness. ? Nausea or vomiting. Get help right away if:  You have a very sudden and severe headache. Summary  A cervicogenic headache is a headache caused by a condition that affects the bones and tissues in your cervical spine.  Your health care provider may diagnose this condition with a physical exam, a nerve block, and imaging tests.  Treatment may include medicine to reduce pain and inflammation, physical therapy, and nerve block injections.  Complementary therapies, such as acupuncture and meditation, may be added to other treatments.  Your treatment plan may involve working with a pain management team that includes your primary health care provider, a pain management specialist, a neurologist, and a physical therapist. This information is not intended to replace advice given to you by your health care provider. Make sure you discuss any questions you have with your health care provider. Document Released: 04/30/2003 Document Revised: 05/30/2018 Document Reviewed: 02/17/2017 Elsevier Patient Education  2020 [place].  Preventing Type 2 Diabetes Mellitus Type 2 diabetes (type 2 diabetes mellitus) is a long-term (chronic)  disease that affects blood sugar (glucose) levels. Normally, a hormone called insulin allows glucose to enter cells in the body. The cells use glucose for energy. In type 2 diabetes, one or both of these problems may  be present:  The body does not make enough insulin.  The body does not respond properly to insulin that it makes (insulin resistance). Insulin resistance or lack of insulin causes excess glucose to build up in the blood instead of going into cells. As a result, high blood glucose (hyperglycemia) develops, which can cause many complications. Being overweight or obese and having an inactive (sedentary) lifestyle can increase your risk for diabetes. Type 2 diabetes can be delayed or prevented by making certain nutrition and lifestyle changes. What nutrition changes can be made?   Eat healthy meals and snacks regularly. Keep a healthy snack with you for when you get hungry between meals, such as fruit or a handful of nuts.  Eat lean meats and proteins that are low in saturated fats, such as chicken, fish, egg whites, and beans. Avoid processed meats.  Eat plenty of fruits and vegetables and plenty of grains that have not been processed (whole grains). It is recommended that you eat: ? 1?2 cups of fruit every day. ? 2?3 cups of vegetables every day. ? 6?8 oz of whole grains every day, such as oats, whole wheat, bulgur, brown rice, quinoa, and millet.  Eat low-fat dairy products, such as milk, yogurt, and cheese.  Eat foods that contain healthy fats, such as nuts, avocado, olive oil, and canola oil.  Drink water throughout the day. Avoid drinks that contain added sugar, such as soda or sweet tea.  Follow instructions from your health care provider about specific eating or drinking restrictions.  Control how much food you eat at a time (portion size). ? Check food labels to find out the serving sizes of foods. ? Use a kitchen scale to weigh amounts of foods.  Saute or steam food instead of frying it. Cook with water or broth instead of oils or butter.  Limit your intake of: ? Salt (sodium). Have no more than 1 tsp (2,400 mg) of sodium a day. If you have heart disease or high blood  pressure, have less than ? tsp (1,500 mg) of sodium a day. ? Saturated fat. This is fat that is solid at room temperature, such as butter or fat on meat. What lifestyle changes can be made? Activity   Do moderate-intensity physical activity for at least 30 minutes on at least 5 days of the week, or as much as told by your health care provider.  Ask your health care provider what activities are safe for you. A mix of physical activities may be best, such as walking, swimming, cycling, and strength training.  Try to add physical activity into your day. For example: ? Park in spots that are farther away than usual, so that you walk more. For example, park in a far corner of the parking lot when you go to the office or the grocery store. ? Take a walk during your lunch break. ? Use stairs instead of elevators or escalators. Weight Loss  Lose weight as directed. Your health care provider can determine how much weight loss is best for you and can help you lose weight safely.  If you are overweight or obese, you may be instructed to lose at least 5?7 % of your body weight. Alcohol and Tobacco   Limit alcohol intake to no more  than 1 drink a day for nonpregnant women and 2 drinks a day for men. One drink equals 12 oz of beer, 5 oz of wine, or 1 oz of hard liquor.  Do not use any tobacco products, such as cigarettes, chewing tobacco, and e-cigarettes. If you need help quitting, ask your health care provider. Work With [place] Provider  Have your blood glucose tested regularly, as told by your health care provider.  Discuss your risk factors and how you can reduce your risk for diabetes.  Get screening tests as told by your health care provider. You may have screening tests regularly, especially if you have certain risk factors for type 2 diabetes.  Make an appointment with a diet and nutrition specialist (registered dietitian). A registered dietitian can help you make a healthy  eating plan and can help you understand portion sizes and food labels. Why are these changes important?  It is possible to prevent or delay type 2 diabetes and related health problems by making lifestyle and nutrition changes.  It can be difficult to recognize signs of type 2 diabetes. The best way to avoid possible damage to your body is to take actions to prevent the disease before you develop symptoms. What can happen if changes are not made?  Your blood glucose levels may keep increasing. Having high blood glucose for a long time is dangerous. Too much glucose in your blood can damage your blood vessels, heart, kidneys, nerves, and eyes.  You may develop prediabetes or type 2 diabetes. Type 2 diabetes can lead to many chronic health problems and complications, such as: ? Heart disease. ? Stroke. ? Blindness. ? Kidney disease. ? Depression. ? Poor circulation in the feet and legs, which could lead to surgical removal (amputation) in severe cases. Where to find support  Ask your health care provider to recommend a registered dietitian, diabetes educator, or weight loss program.  Look for local or online weight loss groups.  Join a gym, fitness club, or outdoor activity group, such as a walking club. Where to find more information To learn more about diabetes and diabetes prevention, visit:  American Diabetes Association (ADA): www.diabetes.[employer] of Diabetes and Digestive and Kidney Diseases: FindSpin.nl To learn more about healthy eating, visit:  The U.S. Department of Agriculture Scientist, research (physical sciences)), Choose My Plate: http://wiley-williams.com/  Office of Disease Prevention and Health Promotion (ODPHP), Dietary Guidelines: SurferLive.at Summary  You can reduce your risk for type 2 diabetes by increasing your physical activity, eating healthy foods, and losing weight as directed.  Talk with your health care  provider about your risk for type 2 diabetes. Ask about any blood tests or screening tests that you need to have. This information is not intended to replace advice given to you by your health care provider. Make sure you discuss any questions you have with your health care provider. Document Released: 06/01/2015 Document Revised: 06/01/2018 Document Reviewed: 03/31/2015 Elsevier Patient Education  2020 [employer].
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
## 2018-10-20 LAB — LIPID PANEL
Chol/HDL Ratio: 2.3 ratio (ref 0.0–4.4)
Cholesterol, Total: 177 mg/dL (ref 100–199)
HDL: 76 mg/dL
LDL Calculated: 82 mg/dL (ref 0–99)
Triglycerides: 93 mg/dL (ref 0–149)
VLDL Cholesterol Cal: 19 mg/dL (ref 5–40)

## 2018-10-20 LAB — T4 AND TSH
T4, Total: 6.7 ug/dL (ref 4.5–12.0)
TSH: 0.512 u[IU]/mL (ref 0.450–4.500)

## 2018-10-30 ENCOUNTER — Telehealth: Payer: Self-pay | Admitting: Family Medicine

## 2018-10-30 NOTE — Telephone Encounter (Signed)
New Message   Pt calling states she received a letter with questions about her insurance but she says she has the [employer] at 100%. Checked documents and no letter was scanned. Please f/u

## 2018-10-30 NOTE — Telephone Encounter (Signed)
Pt was called since she said that she need to go to an orthodontist, Pt was inform that [employer] does not cover dentist or eye specialist but I will refer her to the referral coordinator since she is not sure what kind of specialist the pcp refer her

## 2018-11-01 ENCOUNTER — Other Ambulatory Visit: Payer: Self-pay

## 2018-11-01 ENCOUNTER — Ambulatory Visit (INDEPENDENT_AMBULATORY_CARE_PROVIDER_SITE_OTHER): Payer: Self-pay | Admitting: Family Medicine

## 2018-11-01 ENCOUNTER — Encounter: Payer: Self-pay | Admitting: Family Medicine

## 2018-11-01 ENCOUNTER — Emergency Department (HOSPITAL_COMMUNITY)
Admission: EM | Admit: 2018-11-01 | Discharge: 2018-11-01 | Disposition: A | Discharge status: Home / Self Care | Payer: No Typology Code available for payment source | Attending: Emergency Medicine | Admitting: Emergency Medicine

## 2018-11-01 ENCOUNTER — Emergency Department (HOSPITAL_COMMUNITY): Payer: No Typology Code available for payment source

## 2018-11-01 ENCOUNTER — Encounter (HOSPITAL_COMMUNITY): Payer: Self-pay | Admitting: Emergency Medicine

## 2018-11-01 DIAGNOSIS — M542 Cervicalgia: Secondary | ICD-10-CM

## 2018-11-01 DIAGNOSIS — M25511 Pain in right shoulder: Secondary | ICD-10-CM

## 2018-11-01 DIAGNOSIS — F1210 Cannabis abuse, uncomplicated: Secondary | ICD-10-CM | POA: Insufficient documentation

## 2018-11-01 DIAGNOSIS — F17210 Nicotine dependence, cigarettes, uncomplicated: Secondary | ICD-10-CM | POA: Insufficient documentation

## 2018-11-01 DIAGNOSIS — S060X9S Concussion with loss of consciousness of unspecified duration, sequela: Secondary | ICD-10-CM

## 2018-11-01 MED ORDER — KETOROLAC TROMETHAMINE 30 MG/ML IJ SOLN
30.0000 mg | Freq: Once | INTRAMUSCULAR | Status: AC
  Administered 2018-11-01: 12:00:00 30 mg via INTRAMUSCULAR
  Filled 2018-11-01: qty 1

## 2018-11-01 MED ORDER — TIZANIDINE HCL 4 MG PO TABS
4.0000 mg | ORAL_TABLET | Freq: Once | ORAL | Status: DC
  Filled 2018-11-01: qty 1

## 2018-11-01 MED ORDER — NABUMETONE 750 MG PO TABS: 750.0000 mg | ORAL_TABLET | Freq: Two times a day (BID) | ORAL | 6 refills | Status: DC | PRN

## 2018-11-01 MED ORDER — TIZANIDINE HCL 2 MG PO TABS: 2.0000 mg | ORAL_TABLET | Freq: Four times a day (QID) | ORAL | 1 refills | Status: DC | PRN

## 2018-11-01 MED FILL — tiZANidine HCL 2 MG TABS: 2 | 7 days supply | Qty: 60 | Fill #0

## 2018-11-01 MED FILL — NABUMETONE 750 MG TABS: 750 | 30 days supply | Qty: 60 | Fill #0
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
## 2018-11-01 NOTE — ED Notes (Signed)
Pt refused muscle relaxant because she has "already taken some"
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
## 2018-11-01 NOTE — ED Notes (Signed)
ED Provider at bedside. 

## 2018-11-01 NOTE — ED Notes (Signed)
Pt verbalizes understanding of visit, medications, and follow up care. Opportunity for questions and answers provided.
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
## 2018-11-01 NOTE — ED Notes (Signed)
Patient transported to X-ray 
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
## 2018-11-02 ENCOUNTER — Telehealth: Payer: Self-pay

## 2018-11-02 ENCOUNTER — Ambulatory Visit: Payer: Self-pay | Admitting: Physical Therapy

## 2018-11-02 ENCOUNTER — Telehealth: Payer: Self-pay | Admitting: Family Medicine

## 2018-11-02 NOTE — Telephone Encounter (Signed)
I will place referral to pain management and she can go to urgent care or ED this weekend if needed for acute pain

## 2018-11-02 NOTE — Telephone Encounter (Signed)
Please advise, [facility] called stating they are needing clarification on patient's Neurological consult. Please contact at [phone] ext [phone] per [facility].
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
## 2018-11-05 ENCOUNTER — Telehealth: Payer: Self-pay

## 2018-11-05 ENCOUNTER — Telehealth: Payer: Self-pay | Admitting: *Deleted

## 2018-11-05 NOTE — Telephone Encounter (Signed)
Patient verified DOB Patient received referral information.

## 2018-11-05 NOTE — Telephone Encounter (Signed)
Rollene Fare was returning your call concerning this patient.  Cb# [phone].  Please advise.  Thank you.

## 2018-11-05 NOTE — Telephone Encounter (Signed)
LMOM,

## 2018-11-05 NOTE — Telephone Encounter (Signed)
I called and left a voice mail at Cheryl Wiley's ext to call back.

## 2018-11-06 ENCOUNTER — Encounter: Payer: Self-pay | Admitting: Family Medicine

## 2018-11-06 ENCOUNTER — Other Ambulatory Visit: Payer: Self-pay | Admitting: Family Medicine

## 2018-11-06 DIAGNOSIS — M542 Cervicalgia: Secondary | ICD-10-CM

## 2018-11-06 NOTE — Telephone Encounter (Signed)
Informed patient with what provider stated and she verbalized understanding.  

## 2018-11-06 NOTE — Telephone Encounter (Signed)
This referral was sent to neurosurgery by mistake. Please disregard the referral - redirected to [facility]. This message left on Regina's voice mail.

## 2018-11-06 NOTE — Progress Notes (Signed)
Patient ID: Cheryl Wiley, female   DOB: September 29, 1980, 38 y.o.   MRN: [id number]   Patient s/p recent ED visit on 11/01/2018 and message received on 11/02/2018 that patient requested stronger pain medication. Patient was advised that she could go to urgent care for further evaluation and treatment of her neck pain as no office appointments available and that pain management referral could also be made regarding her neck pain. Patient left message today regarding not seeing an urgent care referral in chart. Will have CMA contact patient to clarify that she does not need referral for urgent care and pain management consult will also be placed. Per chart review it appears that [place] [facility] and [place] are also working on appointments for patient.

## 2018-11-06 NOTE — Telephone Encounter (Signed)
See other message on this

## 2018-11-06 NOTE — Telephone Encounter (Signed)
I called and reached Regina's voice mail again. I gave her my direct number so she can leave a detailed voice mail as to what information she needs clarification on. Will await the return call.

## 2018-11-12 ENCOUNTER — Ambulatory Visit (INDEPENDENT_AMBULATORY_CARE_PROVIDER_SITE_OTHER): Payer: Self-pay | Admitting: Neurology

## 2018-11-12 ENCOUNTER — Encounter: Payer: Self-pay | Admitting: Neurology

## 2018-11-12 ENCOUNTER — Other Ambulatory Visit: Payer: Self-pay

## 2018-11-12 VITALS — BP 119/70 | HR 89 | Temp 97.1°F | Ht 65.0 in | Wt 207.0 lb

## 2018-11-12 DIAGNOSIS — S060X9A Concussion with loss of consciousness of unspecified duration, initial encounter: Secondary | ICD-10-CM

## 2018-11-12 DIAGNOSIS — R55 Syncope and collapse: Secondary | ICD-10-CM

## 2018-11-12 DIAGNOSIS — M542 Cervicalgia: Secondary | ICD-10-CM

## 2018-11-12 MED ORDER — TOPIRAMATE 25 MG PO TABS: 25.0000 mg | ORAL_TABLET | Freq: Two times a day (BID) | ORAL | 2 refills | Status: AC

## 2018-11-12 MED ORDER — TOPIRAMATE 25 MG PO TABS: 25.0000 mg | ORAL_TABLET | Freq: Every day | ORAL | 2 refills | Status: DC

## 2018-11-12 MED FILL — TOPIRAMATE 25 MG TAB: 25 | 30 days supply | Qty: 60 | Fill #0
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
## 2018-11-13 ENCOUNTER — Telehealth: Payer: Self-pay | Admitting: Neurology

## 2018-11-13 NOTE — Telephone Encounter (Signed)
cone discount order sent to GI . They will reach out to the patient to schedule.  100% cone discount expire 01/05/19

## 2018-11-14 ENCOUNTER — Ambulatory Visit: Payer: No Typology Code available for payment source | Attending: Family Medicine | Admitting: Physical Therapy

## 2018-11-19 ENCOUNTER — Encounter: Payer: Self-pay | Admitting: Physical Medicine & Rehabilitation

## 2018-11-26 ENCOUNTER — Other Ambulatory Visit: Payer: Self-pay

## 2018-11-26 ENCOUNTER — Encounter: Payer: Self-pay | Admitting: Neurology

## 2018-12-04 ENCOUNTER — Telehealth: Payer: Self-pay

## 2018-12-04 ENCOUNTER — Other Ambulatory Visit: Payer: Self-pay

## 2018-12-04 ENCOUNTER — Inpatient Hospital Stay: Admission: RE | Admit: 2018-12-04 | Payer: Self-pay | Source: Ambulatory Visit

## 2018-12-04 NOTE — Telephone Encounter (Signed)
I called pt about her appt next week. I stated she is schedule for a 6 week follow up with Dr.Sethi and her MRI have not been done. Pt stated it was today and she forgot about them.  I stated in computer it states she was a no show. I I gave pt number for GI to r/s her MRI. Pt verbalized understanding.

## 2018-12-12 ENCOUNTER — Telehealth: Payer: Self-pay

## 2018-12-12 ENCOUNTER — Ambulatory Visit: Payer: Self-pay | Admitting: Neurology

## 2018-12-12 NOTE — Telephone Encounter (Signed)
I called pt that her MRI neck and MRi brain has not been completed and she was a no show. PT stated she thought they were reschedule by her. I stated she was call on 10/13/ and given number to GI. Pt stated she has a lot going on. I stated her appt will be cancel and to call back to r/s with Dr.SEthi once scans are done. Pt verbalized understtanding.I gave her GI imaging number again to r/s.

## 2018-12-20 ENCOUNTER — Encounter: Payer: Self-pay | Admitting: Physical Medicine & Rehabilitation

## 2018-12-20 ENCOUNTER — Other Ambulatory Visit: Payer: Self-pay

## 2018-12-20 ENCOUNTER — Encounter: Payer: Self-pay | Attending: Physical Medicine & Rehabilitation | Admitting: Physical Medicine & Rehabilitation

## 2018-12-20 VITALS — BP 118/81 | HR 86 | Temp 97.5°F | Ht 65.0 in | Wt 208.0 lb

## 2018-12-20 DIAGNOSIS — G479 Sleep disorder, unspecified: Secondary | ICD-10-CM | POA: Insufficient documentation

## 2018-12-20 DIAGNOSIS — M791 Myalgia, unspecified site: Secondary | ICD-10-CM | POA: Insufficient documentation

## 2018-12-20 DIAGNOSIS — M47812 Spondylosis without myelopathy or radiculopathy, cervical region: Secondary | ICD-10-CM | POA: Insufficient documentation

## 2018-12-20 DIAGNOSIS — M542 Cervicalgia: Secondary | ICD-10-CM | POA: Insufficient documentation

## 2018-12-20 DIAGNOSIS — M4802 Spinal stenosis, cervical region: Secondary | ICD-10-CM | POA: Insufficient documentation

## 2018-12-20 MED ORDER — AMITRIPTYLINE HCL 10 MG PO TABS: 10.0000 mg | ORAL_TABLET | Freq: Every day | ORAL | 1 refills | Status: DC

## 2018-12-20 MED ORDER — METHOCARBAMOL 500 MG PO TABS: 500.0000 mg | ORAL_TABLET | Freq: Two times a day (BID) | ORAL | 1 refills | Status: AC | PRN

## 2018-12-20 MED FILL — METHOCARBAMOL 500 MG TABS: 500 | 30 days supply | Qty: 60 | Fill #0

## 2018-12-20 MED FILL — AMITRIPTYLINE HCL 10 MG TAB: 10 | 30 days supply | Qty: 30 | Fill #0
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
## 2018-12-21 ENCOUNTER — Ambulatory Visit (INDEPENDENT_AMBULATORY_CARE_PROVIDER_SITE_OTHER): Payer: Self-pay | Admitting: Obstetrics & Gynecology

## 2018-12-21 VITALS — BP 122/73 | HR 80 | Temp 98.4°F | Wt 203.7 lb

## 2018-12-21 DIAGNOSIS — Z7251 High risk heterosexual behavior: Secondary | ICD-10-CM

## 2018-12-21 DIAGNOSIS — Z1151 Encounter for screening for human papillomavirus (HPV): Secondary | ICD-10-CM

## 2018-12-21 DIAGNOSIS — Z124 Encounter for screening for malignant neoplasm of cervix: Secondary | ICD-10-CM

## 2018-12-21 DIAGNOSIS — N898 Other specified noninflammatory disorders of vagina: Secondary | ICD-10-CM

## 2018-12-21 DIAGNOSIS — Z0000 Encounter for general adult medical examination without abnormal findings: Secondary | ICD-10-CM

## 2018-12-21 DIAGNOSIS — Z113 Encounter for screening for infections with a predominantly sexual mode of transmission: Secondary | ICD-10-CM

## 2018-12-21 DIAGNOSIS — N760 Acute vaginitis: Secondary | ICD-10-CM

## 2018-12-21 DIAGNOSIS — B9689 Other specified bacterial agents as the cause of diseases classified elsewhere: Secondary | ICD-10-CM

## 2018-12-21 NOTE — Progress Notes (Signed)
   Subjective:    Patient ID: Cheryl Wiley, female    DOB: [birth year], 38 y.o.   MRN: [id number]  HPI 38 yo single G0 here for vaginal itching, started 2 days ago. She had high risk sex (had anal first and then vaginal with no cleaning. Her partner is now in the ER with dysuria). She still has pain and still wants a hysterectomy. She got   Review of Systems TSH normal 10/19/18    Objective:   Physical Exam Breathing, conversing, and ambulating normally Well nourished, well hydrated Black female, no apparent distress Abd- benign Bimanual- tender t/o, no masses       Assessment & Plan:  Preventative care-  pap smear today STI testing per patient request CPP- I discussed this with Dr. Rip Harbour who agrees to treat her surgically (TAH/BS/?BSO). I will send Jordan a message to schedule

## 2018-12-22 LAB — HIV ANTIBODY (ROUTINE TESTING W REFLEX): HIV Screen 4th Generation wRfx: NONREACTIVE

## 2018-12-22 LAB — HEPATITIS C ANTIBODY: Hep C Virus Ab: <0.1 s/co ratio (ref 0.0–0.9)

## 2018-12-22 LAB — RPR: RPR Ser Ql: NONREACTIVE

## 2018-12-22 LAB — HEPATITIS B SURFACE ANTIGEN: Hepatitis B Surface Ag: NEGATIVE

## 2018-12-24 ENCOUNTER — Telehealth: Payer: Self-pay | Admitting: Emergency Medicine

## 2018-12-24 DIAGNOSIS — B9689 Other specified bacterial agents as the cause of diseases classified elsewhere: Secondary | ICD-10-CM

## 2018-12-24 LAB — CERVICOVAGINAL ANCILLARY ONLY
Bacterial Vaginitis (gardnerella): POSITIVE — AB
Candida Glabrata: NEGATIVE
Candida Vaginitis: NEGATIVE
Chlamydia: NEGATIVE
Comment: NEGATIVE
Comment: NEGATIVE
Comment: NEGATIVE
Comment: NEGATIVE
Comment: NEGATIVE
Comment: NORMAL
Neisseria Gonorrhea: NEGATIVE
Trichomonas: POSITIVE — AB

## 2018-12-24 NOTE — Telephone Encounter (Signed)
Pt called and left a message on the nurse voicemail line stating that she was calling in regards to her appointment on 10/30 to get test results.

## 2018-12-25 ENCOUNTER — Other Ambulatory Visit: Payer: Self-pay | Admitting: Obstetrics & Gynecology

## 2018-12-25 MED ORDER — METRONIDAZOLE 500 MG PO TABS: 500.0000 mg | ORAL_TABLET | Freq: Two times a day (BID) | ORAL | 0 refills | Status: DC

## 2018-12-25 MED ORDER — METRONIDAZOLE 500 MG PO TABS: ORAL_TABLET | ORAL | 0 refills | Status: DC

## 2018-12-25 MED FILL — metroNIDAZOLE 500 MG TABS: 500 | 1 days supply | Qty: 4 | Fill #0

## 2018-12-25 MED FILL — metroNIDAZOLE 500 MG TABS: 500 | 7 days supply | Qty: 14 | Fill #0

## 2018-12-25 NOTE — Addendum Note (Signed)
Addended by: Samuel Germany on: 12/25/2018 10:29 AM   Modules accepted: Orders

## 2018-12-25 NOTE — Telephone Encounter (Signed)
I called and notified Cheryl Wiley of negative results for Hep B,hep C, RPR and that we did not have pap results yet; but she would get call if abnormal  And  A letter if normal. I also informed her wet prep showed + trich and BV and will send RX for Flagyl to  Her pharmacy. Advised partner needs to be treated ( she states he has been) and avoid intimate contact/ intercourse until both treated and wait 1-2 weeks. She voices understanding. Linda,RN

## 2018-12-25 NOTE — Progress Notes (Signed)
Flagyl ordred to treat trich. Patient notified via mychart.

## 2018-12-26 LAB — CYTOLOGY - PAP
Comment: NEGATIVE
Diagnosis: NEGATIVE
High risk HPV: NEGATIVE

## 2018-12-27 ENCOUNTER — Telehealth: Payer: Self-pay

## 2018-12-27 NOTE — Telephone Encounter (Signed)
Pt called office requesting information regarding upcoming surgery. Assisted pt to view appt time in MyChart for hysterectomy and explained to pt Pre-Op will be calling with further instructions. Pt verbalizes understanding.

## 2018-12-28 ENCOUNTER — Other Ambulatory Visit (HOSPITAL_COMMUNITY)
Admission: RE | Admit: 2018-12-28 | Discharge: 2018-12-28 | Disposition: A | Discharge status: Home / Self Care | Payer: Self-pay | Source: Ambulatory Visit | Attending: Obstetrics & Gynecology | Admitting: Obstetrics & Gynecology

## 2018-12-28 DIAGNOSIS — Z01812 Encounter for preprocedural laboratory examination: Secondary | ICD-10-CM | POA: Insufficient documentation

## 2018-12-28 DIAGNOSIS — Z20828 Contact with and (suspected) exposure to other viral communicable diseases: Secondary | ICD-10-CM | POA: Insufficient documentation

## 2018-12-29 LAB — NOVEL CORONAVIRUS, NAA (HOSP ORDER, SEND-OUT TO REF LAB; TAT 18-24 HRS): SARS-CoV-2, NAA: NOT DETECTED

## 2018-12-31 ENCOUNTER — Other Ambulatory Visit: Payer: Self-pay

## 2018-12-31 ENCOUNTER — Encounter (HOSPITAL_COMMUNITY): Payer: Self-pay | Admitting: *Deleted
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
## 2019-01-01 ENCOUNTER — Other Ambulatory Visit: Payer: Self-pay

## 2019-01-01 ENCOUNTER — Encounter (HOSPITAL_COMMUNITY)
Admission: RE | Disposition: A | Discharge status: Home / Self Care | Payer: Self-pay | Source: Home / Self Care | Attending: Obstetrics & Gynecology

## 2019-01-01 ENCOUNTER — Inpatient Hospital Stay (HOSPITAL_COMMUNITY)
Admission: RE | Admit: 2019-01-01 | Discharge: 2019-01-02 | DRG: 743 | Disposition: A | Discharge status: Home / Self Care | Payer: Self-pay | Attending: Obstetrics & Gynecology | Admitting: Obstetrics & Gynecology

## 2019-01-01 ENCOUNTER — Encounter (HOSPITAL_COMMUNITY): Payer: Self-pay | Admitting: *Deleted

## 2019-01-01 ENCOUNTER — Inpatient Hospital Stay (HOSPITAL_COMMUNITY): Payer: Self-pay | Admitting: Anesthesiology

## 2019-01-01 DIAGNOSIS — Z20828 Contact with and (suspected) exposure to other viral communicable diseases: Secondary | ICD-10-CM | POA: Diagnosis present

## 2019-01-01 DIAGNOSIS — Z79899 Other long term (current) drug therapy: Secondary | ICD-10-CM

## 2019-01-01 DIAGNOSIS — Z9889 Other specified postprocedural states: Secondary | ICD-10-CM

## 2019-01-01 DIAGNOSIS — D500 Iron deficiency anemia secondary to blood loss (chronic): Secondary | ICD-10-CM | POA: Diagnosis present

## 2019-01-01 DIAGNOSIS — N920 Excessive and frequent menstruation with regular cycle: Secondary | ICD-10-CM

## 2019-01-01 DIAGNOSIS — D649 Anemia, unspecified: Secondary | ICD-10-CM

## 2019-01-01 DIAGNOSIS — G8929 Other chronic pain: Secondary | ICD-10-CM | POA: Diagnosis present

## 2019-01-01 DIAGNOSIS — F17210 Nicotine dependence, cigarettes, uncomplicated: Secondary | ICD-10-CM | POA: Diagnosis present

## 2019-01-01 DIAGNOSIS — N731 Chronic parametritis and pelvic cellulitis: Principal | ICD-10-CM | POA: Diagnosis present

## 2019-01-01 DIAGNOSIS — N7011 Chronic salpingitis: Secondary | ICD-10-CM

## 2019-01-01 DIAGNOSIS — Z79891 Long term (current) use of opiate analgesic: Secondary | ICD-10-CM

## 2019-01-01 DIAGNOSIS — R102 Pelvic and perineal pain: Secondary | ICD-10-CM | POA: Diagnosis present

## 2019-01-01 DIAGNOSIS — D259 Leiomyoma of uterus, unspecified: Secondary | ICD-10-CM

## 2019-01-01 DIAGNOSIS — Z8249 Family history of ischemic heart disease and other diseases of the circulatory system: Secondary | ICD-10-CM

## 2019-01-01 DIAGNOSIS — K660 Peritoneal adhesions (postprocedural) (postinfection): Secondary | ICD-10-CM | POA: Diagnosis present

## 2019-01-01 HISTORY — DX: Anemia, unspecified: D64.9

## 2019-01-01 HISTORY — DX: Personal history of other (healed) physical injury and trauma: Z87.828

## 2019-01-01 HISTORY — DX: Anxiety disorder, unspecified: F41.9

## 2019-01-01 HISTORY — DX: Prediabetes: R73.03

## 2019-01-01 HISTORY — PX: ABDOMINAL HYSTERECTOMY: SHX81

## 2019-01-01 HISTORY — PX: HYSTERECTOMY ABDOMINAL WITH SALPINGECTOMY: SHX6725

## 2019-01-01 LAB — ABO/RH: ABO/RH(D): O POS

## 2019-01-01 LAB — CBC
HCT: 35.2 % — ABNORMAL LOW (ref 36.0–46.0)
Hemoglobin: 10.5 g/dL — ABNORMAL LOW (ref 12.0–15.0)
MCH: 27 pg (ref 26.0–34.0)
MCHC: 29.8 g/dL — ABNORMAL LOW (ref 30.0–36.0)
MCV: 90.5 fL (ref 80.0–100.0)
Platelets: 326 10*3/uL (ref 150–400)
RBC: 3.89 MIL/uL (ref 3.87–5.11)
RDW: 17.5 % — ABNORMAL HIGH (ref 11.5–15.5)
WBC: 6.4 10*3/uL (ref 4.0–10.5)
nRBC: 0 % (ref 0.0–0.2)

## 2019-01-01 LAB — COMPREHENSIVE METABOLIC PANEL
ALT: 17 U/L (ref 0–44)
AST: 27 U/L (ref 15–41)
Albumin: 2.8 g/dL — ABNORMAL LOW (ref 3.5–5.0)
Alkaline Phosphatase: 35 U/L — ABNORMAL LOW (ref 38–126)
Anion gap: 8 (ref 5–15)
BUN: <5 mg/dL — ABNORMAL LOW (ref 6–20)
CO2: 24 mmol/L (ref 22–32)
Calcium: 8.5 mg/dL — ABNORMAL LOW (ref 8.9–10.3)
Chloride: 107 mmol/L (ref 98–111)
Creatinine, Ser: 0.8 mg/dL (ref 0.44–1.00)
GFR calc Af Amer: >60 mL/min (ref >60)
GFR calc non Af Amer: >60 mL/min (ref >60)
Glucose, Bld: 96 mg/dL (ref 70–99)
Potassium: 3.6 mmol/L (ref 3.5–5.1)
Sodium: 139 mmol/L (ref 135–145)
Total Bilirubin: 0.6 mg/dL (ref 0.3–1.2)
Total Protein: 4.8 g/dL — ABNORMAL LOW (ref 6.5–8.1)

## 2019-01-01 LAB — TYPE AND SCREEN
ABO/RH(D): O POS
Antibody Screen: NEGATIVE

## 2019-01-01 LAB — POCT PREGNANCY, URINE: Preg Test, Ur: NEGATIVE

## 2019-01-01 SURGERY — HYSTERECTOMY, TOTAL, ABDOMINAL, WITH SALPINGECTOMY
Anesthesia: General | Site: Abdomen | Laterality: Bilateral

## 2019-01-01 MED ORDER — CEFAZOLIN SODIUM-DEXTROSE 2-4 GM/100ML-% IV SOLN
INTRAVENOUS | Status: AC
  Filled 2019-01-01: qty 100

## 2019-01-01 MED ORDER — ROCURONIUM BROMIDE 10 MG/ML (PF) SYRINGE
PREFILLED_SYRINGE | INTRAVENOUS | Status: AC
  Filled 2019-01-01: qty 10

## 2019-01-01 MED ORDER — DEXAMETHASONE SODIUM PHOSPHATE 10 MG/ML IJ SOLN
INTRAMUSCULAR | Status: AC
  Filled 2019-01-01: qty 1

## 2019-01-01 MED ORDER — FENTANYL CITRATE (PF) 100 MCG/2ML IJ SOLN
INTRAMUSCULAR | Status: AC
  Filled 2019-01-01: qty 2

## 2019-01-01 MED ORDER — HYDROMORPHONE HCL 1 MG/ML IJ SOLN
INTRAMUSCULAR | Status: AC
  Administered 2019-01-01: 2 mg via INTRAVENOUS
  Filled 2019-01-01: qty 2

## 2019-01-01 MED ORDER — PROPOFOL 10 MG/ML IV BOLUS
INTRAVENOUS | Status: DC | PRN
  Administered 2019-01-01: 150 mg via INTRAVENOUS

## 2019-01-01 MED ORDER — OXYCODONE HCL 5 MG PO TABS
ORAL_TABLET | ORAL | Status: AC
  Filled 2019-01-01: qty 2

## 2019-01-01 MED ORDER — FENTANYL CITRATE (PF) 100 MCG/2ML IJ SOLN
25.0000 ug | INTRAMUSCULAR | Status: DC | PRN
  Administered 2019-01-01 (×3): 50 ug via INTRAVENOUS

## 2019-01-01 MED ORDER — ONDANSETRON HCL 4 MG/2ML IJ SOLN
INTRAMUSCULAR | Status: AC
  Filled 2019-01-01: qty 2

## 2019-01-01 MED ORDER — CEFAZOLIN SODIUM-DEXTROSE 2-4 GM/100ML-% IV SOLN
2.0000 g | Freq: Once | INTRAVENOUS | Status: AC
  Administered 2019-01-01: 10:00:00 2 g via INTRAVENOUS

## 2019-01-01 MED ORDER — OXYCODONE HCL 5 MG/5ML PO SOLN: 5.0000 mg | Freq: Once | ORAL | Status: DC | PRN

## 2019-01-01 MED ORDER — FENTANYL CITRATE (PF) 250 MCG/5ML IJ SOLN
INTRAMUSCULAR | Status: AC
  Filled 2019-01-01: qty 5

## 2019-01-01 MED ORDER — BUPIVACAINE HCL (PF) 0.5 % IJ SOLN
INTRAMUSCULAR | Status: DC | PRN
  Administered 2019-01-01: 30 mL

## 2019-01-01 MED ORDER — PHENYLEPHRINE 40 MCG/ML (10ML) SYRINGE FOR IV PUSH (FOR BLOOD PRESSURE SUPPORT)
PREFILLED_SYRINGE | INTRAVENOUS | Status: AC
  Filled 2019-01-01: qty 10

## 2019-01-01 MED ORDER — ONDANSETRON HCL 4 MG/2ML IJ SOLN: 4.0000 mg | Freq: Four times a day (QID) | INTRAMUSCULAR | Status: DC | PRN

## 2019-01-01 MED ORDER — LACTATED RINGERS IV SOLN
INTRAVENOUS | Status: DC
  Administered 2019-01-01: 17:00:00 via INTRAVENOUS

## 2019-01-01 MED ORDER — DEXAMETHASONE SODIUM PHOSPHATE 10 MG/ML IJ SOLN
INTRAMUSCULAR | Status: DC | PRN
  Administered 2019-01-01: 5 mg via INTRAVENOUS

## 2019-01-01 MED ORDER — LIDOCAINE 2% (20 MG/ML) 5 ML SYRINGE
INTRAMUSCULAR | Status: DC | PRN
  Administered 2019-01-01: 60 mg via INTRAVENOUS

## 2019-01-01 MED ORDER — OXYCODONE HCL 5 MG PO TABS
5.0000 mg | ORAL_TABLET | ORAL | Status: DC | PRN
  Administered 2019-01-01 – 2019-01-02 (×4): 10 mg via ORAL
  Filled 2019-01-01 (×3): qty 2

## 2019-01-01 MED ORDER — OXYCODONE HCL 5 MG PO TABS: 5.0000 mg | ORAL_TABLET | Freq: Once | ORAL | Status: DC | PRN

## 2019-01-01 MED ORDER — IBUPROFEN 800 MG PO TABS
800.0000 mg | ORAL_TABLET | Freq: Three times a day (TID) | ORAL | Status: DC
  Administered 2019-01-01 – 2019-01-02 (×4): 800 mg via ORAL
  Filled 2019-01-01 (×4): qty 1

## 2019-01-01 MED ORDER — SUGAMMADEX SODIUM 200 MG/2ML IV SOLN
INTRAVENOUS | Status: DC | PRN
  Administered 2019-01-01: 200 mg via INTRAVENOUS

## 2019-01-01 MED ORDER — PROPOFOL 10 MG/ML IV BOLUS
INTRAVENOUS | Status: AC
  Filled 2019-01-01: qty 20

## 2019-01-01 MED ORDER — MIDAZOLAM HCL 2 MG/2ML IJ SOLN
INTRAMUSCULAR | Status: AC
  Filled 2019-01-01: qty 2

## 2019-01-01 MED ORDER — ONDANSETRON HCL 4 MG PO TABS: 4.0000 mg | ORAL_TABLET | Freq: Four times a day (QID) | ORAL | Status: DC | PRN

## 2019-01-01 MED ORDER — KETOROLAC TROMETHAMINE 30 MG/ML IJ SOLN
INTRAMUSCULAR | Status: AC
  Filled 2019-01-01: qty 1

## 2019-01-01 MED ORDER — ONDANSETRON HCL 4 MG/2ML IJ SOLN
INTRAMUSCULAR | Status: DC | PRN
  Administered 2019-01-01: 4 mg via INTRAVENOUS

## 2019-01-01 MED ORDER — FENTANYL CITRATE (PF) 100 MCG/2ML IJ SOLN
INTRAMUSCULAR | Status: DC | PRN
  Administered 2019-01-01: 50 ug via INTRAVENOUS
  Administered 2019-01-01: 100 ug via INTRAVENOUS
  Administered 2019-01-01 (×4): 50 ug via INTRAVENOUS

## 2019-01-01 MED ORDER — ROCURONIUM BROMIDE 50 MG/5ML IV SOSY
PREFILLED_SYRINGE | INTRAVENOUS | Status: DC | PRN
  Administered 2019-01-01: 50 mg via INTRAVENOUS

## 2019-01-01 MED ORDER — HYDROMORPHONE HCL 1 MG/ML IJ SOLN
2.0000 mg | INTRAMUSCULAR | Status: DC | PRN
  Administered 2019-01-01: 1 mg via INTRAVENOUS
  Administered 2019-01-01 – 2019-01-02 (×3): 2 mg via INTRAVENOUS
  Filled 2019-01-01 (×3): qty 2

## 2019-01-01 MED ORDER — BUPIVACAINE HCL (PF) 0.5 % IJ SOLN
INTRAMUSCULAR | Status: AC
  Filled 2019-01-01: qty 30

## 2019-01-01 MED ORDER — LACTATED RINGERS IV SOLN
INTRAVENOUS | Status: DC
  Administered 2019-01-01 (×2): via INTRAVENOUS

## 2019-01-01 MED ORDER — LIDOCAINE 2% (20 MG/ML) 5 ML SYRINGE
INTRAMUSCULAR | Status: AC
  Filled 2019-01-01: qty 5

## 2019-01-01 SURGICAL SUPPLY — 38 items
BENZOIN TINCTURE PRP APPL 2/3 (GAUZE/BANDAGES/DRESSINGS) ×3 IMPLANT
CANISTER SUCT 3000ML PPV (MISCELLANEOUS) ×3 IMPLANT
CLOSURE WOUND 1/2 X4 (GAUZE/BANDAGES/DRESSINGS) ×1
COVER WAND RF STERILE (DRAPES) ×3 IMPLANT
DECANTER SPIKE VIAL GLASS SM (MISCELLANEOUS) IMPLANT
DRAPE CESAREAN BIRTH W POUCH (DRAPES) ×3 IMPLANT
DRAPE WARM FLUID 44X44 (DRAPES) IMPLANT
DRSG OPSITE POSTOP 4X10 (GAUZE/BANDAGES/DRESSINGS) ×3 IMPLANT
DURAPREP 26ML APPLICATOR (WOUND CARE) ×3 IMPLANT
GAUZE 4X4 16PLY RFD (DISPOSABLE) ×3 IMPLANT
GAUZE SPONGE 4X4 16PLY XRAY LF (GAUZE/BANDAGES/DRESSINGS) ×3 IMPLANT
GLOVE BIO SURGEON STRL SZ 6.5 (GLOVE) ×2 IMPLANT
GLOVE BIO SURGEONS STRL SZ 6.5 (GLOVE) ×1
GLOVE BIOGEL PI IND STRL 7.0 (GLOVE) ×2 IMPLANT
GLOVE BIOGEL PI INDICATOR 7.0 (GLOVE) ×4
GOWN STRL REUS W/ TWL LRG LVL3 (GOWN DISPOSABLE) ×3 IMPLANT
GOWN STRL REUS W/TWL LRG LVL3 (GOWN DISPOSABLE) ×6
HEMOSTAT ARISTA ABSORB 3G PWDR (HEMOSTASIS) ×3 IMPLANT
HIBICLENS CHG 4% 4OZ BTL (MISCELLANEOUS) ×3 IMPLANT
KIT TURNOVER KIT B (KITS) ×3 IMPLANT
NEEDLE SPNL 18GX3.5 QUINCKE PK (NEEDLE) ×3 IMPLANT
NS IRRIG 1000ML POUR BTL (IV SOLUTION) ×3 IMPLANT
PACK ABDOMINAL GYN (CUSTOM PROCEDURE TRAY) ×3 IMPLANT
PAD ABD 7.5X8 STRL (GAUZE/BANDAGES/DRESSINGS) ×3 IMPLANT
PAD ARMBOARD 7.5X6 YLW CONV (MISCELLANEOUS) ×3 IMPLANT
PAD OB MATERNITY 4.3X12.25 (PERSONAL CARE ITEMS) ×3 IMPLANT
SPECIMEN JAR MEDIUM (MISCELLANEOUS) ×3 IMPLANT
SPONGE LAP 18X18 RF (DISPOSABLE) ×12 IMPLANT
STRIP CLOSURE SKIN 1/2X4 (GAUZE/BANDAGES/DRESSINGS) ×2 IMPLANT
SUT CHROMIC 3 0 SH 27 (SUTURE) IMPLANT
SUT PDS AB 0 CTX 60 (SUTURE) IMPLANT
SUT VIC AB 0 CT1 36 (SUTURE) ×6 IMPLANT
SUT VIC AB 2-0 CT1 18 (SUTURE) ×9 IMPLANT
SUT VIC AB 3-0 CT1 27 (SUTURE) ×2
SUT VIC AB 3-0 CT1 TAPERPNT 27 (SUTURE) ×1 IMPLANT
SYR 30ML LL (SYRINGE) ×3 IMPLANT
TOWEL GREEN STERILE FF (TOWEL DISPOSABLE) ×6 IMPLANT
TRAY FOLEY W/BAG SLVR 14FR (SET/KITS/TRAYS/PACK) ×3 IMPLANT

## 2019-01-01 NOTE — Op Note (Addendum)
01/01/2019  10:46 AM  PATIENT:  Cheryl Wiley  38 y.o. female  PRE-OPERATIVE DIAGNOSIS:  Chronic pelvic pain, chronic PID, hydrosalpinges, anemia, menorrhagia  POST-OPERATIVE DIAGNOSIS:  Same   PROCEDURE:  Procedure(s): ABDOMINAL HYSTERECTOMY WITH SALPINGECTOMY (Bilateral)  EXTENSIVE LYSIS OF ADHESIONS  SURGEON:  Surgeon(s) and Role:    * Hulan Fray, Wilhemina Cash, MD - Primary    * Donnamae Jude, MD - Assisting   ASSISTANTS: Mercy Moore, MS3   ANESTHESIA:   local and general  EBL:  350 mL   BLOOD ADMINISTERED:none  DRAINS: Urinary Catheter (Foley)   LOCAL MEDICATIONS USED:  MARCAINE     SPECIMEN:  Source of Specimen:  uterus and tubes  DISPOSITION OF SPECIMEN:  PATHOLOGY  COUNTS:  YES  TOURNIQUET:  * No tourniquets in log *  DICTATION: .Dragon Dictation  PLAN OF CARE: Admit to inpatient   PATIENT DISPOSITION:  PACU - hemodynamically stable.   Delay start of Pharmacological VTE agent (>24hrs) due to surgical blood loss or risk of bleeding: not applicable     The risks, benefits, and alternatives of surgery were explained, understood, and accepted. Consents were signed. All questions were answered. She was taken to the operating room and general anesthesia was applied without complication. Her abdomen and vagina were prepped and draped in the usual sterile fashion. A Foley catheter was placed which drained clear urine throughout the case. A transverse incision was made approximately 2 cm above her symphysis pubis after injecting 30 mL of 0.5% marcaine in the subcutaneous tissue. The incision was carried down through the subcutaneous tissue to the fascia. Bleeding encountered was cauterized with the Bovie. The fascia was scored the midline and the fascial incision was extended bilaterally. The pyramidalis muscles were separated in a transverse fashion using electrosurgical technique. Approximately 2 cm of the rectus muscles were separated in a transverse fashion in the  midline using electrosurgical technique. Hemostasis was maintained. The peritoneum was entered with hemostats and the peritoneal incision was extended bilaterally with the Bovie, taking care to avoid bowel and bladder. The patient was placed in Trendelenburg position and her pelvis was inspected. There were extensive adhesions throughout. The bladder was densely adherent to the entire anterior side of the uterus up to the fundus. The ovaries were completely obscured by adhesions. Both tubes were swollen and adherent to the bowel and the uterus. I carefully dissected the ovaries, tubes, and bladder away from their adhesions, taking great care to avoid damage to the bowel and bladder. I was then able to pack the bowel out of the abdominal cavity.  Coker clamps were used to elevate the uterus. The round ligaments were identified clamped cut and ligated. A bladder flap was created anteriorly and the bladder was pushed out of the operative site with a moist lap sponge. The uteroovarian ligaments were identified bilaterally. They were clamped, cut, and ligated. Excellent hemostasis was noted. 2-0 Vicryl sutures used throughout this case unless otherwise specified. The uterine vessels were skeletonized, clamped, cut, and doubly ligated.  The remainder of the cervix was separated from its pelvic attachments using the same clamp, cut, ligate technique. Curved Heaney clamps were used to clamp beneath the cervix. The cervix and uterus were removed and sent to pathology. The vaginal cuff was noted to be hemostatic after placing a figure of eight suture.Both oviducts were removed.  The pelvis was irrigated with 1 L of warm normal saline. All pedicles were noted to be hemostatic. I placed Arista over the vaginal  cuff.  The sponges were removed from the pelvis. The rectus muscles were inspected and hemostasis was assured. The fascia was closed with a 0 Vicryl running nonlocking suture. The subcutaneous tissue was irrigated, clean,  dry.  A subcuticular closure was done with 3-0 vicryl suture. She tolerated the procedure well and was taken to the recovery room in stable condition. Her Foley catheter drained clear urine throughout.  An experienced assistant was required given the standard of surgical care given the complexity of the case.  This assistant was needed for exposure, dissection, suctioning, retraction, instrument exchange, assisting with delivery with administration of fundal pressure, and for overall help during the procedure.  ----- Message -----

## 2019-01-01 NOTE — Transfer of Care (Signed)
Immediate Anesthesia Transfer of Care Note  Patient: Cheryl Wiley  Procedure(s) Performed: ABDOMINAL HYSTERECTOMY WITH SALPINGECTOMY (Bilateral Abdomen)  Patient Location: PACU  Anesthesia Type:General  Level of Consciousness: awake and alert   Airway & Oxygen Therapy: Patient Spontanous Breathing and Patient connected to nasal cannula oxygen  Post-op Assessment: Report given to RN and Post -op Vital signs reviewed and stable  Post vital signs: Reviewed and stable  Last Vitals:  Vitals Value Taken Time  BP 144/93 01/01/19 1058  Temp    Pulse 102 01/01/19 1100  Resp 21 01/01/19 1100  SpO2 96 % 01/01/19 1100  Vitals shown include unvalidated device data.  Last Pain:  Vitals:   01/01/19 0728  TempSrc:   PainSc: 0-No pain      Patients Stated Pain Goal: 5 ([id number])  Complications: No apparent anesthesia complications

## 2019-01-01 NOTE — Progress Notes (Signed)
Notified Dr. Marin Comment of pt. Drinking several sips coffee with cream  this am @ 0500. Last usage of marijuana was yesterday.

## 2019-01-01 NOTE — Anesthesia Preprocedure Evaluation (Signed)
Anesthesia Evaluation  Patient identified by MRN, date of birth, ID band Patient awake    Reviewed: Allergy & Precautions, H&P , NPO status , Patient's Chart, lab work & pertinent test results  Airway Mallampati: II   Neck ROM: full    Dental   Pulmonary Current Smoker,    breath sounds clear to auscultation       Cardiovascular negative cardio ROS   Rhythm:regular Rate:Normal     Neuro/Psych PSYCHIATRIC DISORDERS Anxiety    GI/Hepatic   Endo/Other    Renal/GU      Musculoskeletal   Abdominal   Peds  Hematology  (+) Blood dyscrasia, anemia ,   Anesthesia Other Findings   Reproductive/Obstetrics                             Anesthesia Physical Anesthesia Plan  ASA: II  Anesthesia Plan: General   Post-op Pain Management:    Induction: Intravenous  PONV Risk Score and Plan: 2 and Ondansetron, Dexamethasone, Midazolam and Treatment may vary due to age or medical condition  Airway Management Planned: Oral ETT  Additional Equipment:   Intra-op Plan:   Post-operative Plan: Extubation in OR  Informed Consent: I have reviewed the patients History and Physical, chart, labs and discussed the procedure including the risks, benefits and alternatives for the proposed anesthesia with the patient or authorized representative who has indicated his/her understanding and acceptance.       Plan Discussed with: CRNA, Anesthesiologist and Surgeon  Anesthesia Plan Comments:         Anesthesia Quick Evaluation
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
## 2019-01-01 NOTE — Anesthesia Procedure Notes (Signed)
Procedure Name: Intubation Date/Time: 01/01/2019 9:29 AM Performed by: Inda Coke, CRNA Pre-anesthesia Checklist: Patient identified, Emergency Drugs available, Suction available and Patient being monitored Patient Re-evaluated:Patient Re-evaluated prior to induction Oxygen Delivery Method: Circle System Utilized Preoxygenation: Pre-oxygenation with 100% oxygen Induction Type: IV induction Ventilation: Mask ventilation without difficulty Laryngoscope Size: Mac and 3 Grade View: Grade I Tube type: Oral Tube size: 7.0 mm Number of attempts: 1 Airway Equipment and Method: Stylet and Oral airway Placement Confirmation: ETT inserted through vocal cords under direct vision,  positive ETCO2 and breath sounds checked- equal and bilateral Secured at: 21 cm Tube secured with: Tape Dental Injury: Teeth and Oropharynx as per pre-operative assessment

## 2019-01-02 ENCOUNTER — Encounter (HOSPITAL_COMMUNITY): Payer: Self-pay | Admitting: Obstetrics & Gynecology

## 2019-01-02 ENCOUNTER — Telehealth: Payer: Self-pay | Admitting: Family Medicine

## 2019-01-02 LAB — CBC
HCT: 30.5 % — ABNORMAL LOW (ref 36.0–46.0)
Hemoglobin: 9.6 g/dL — ABNORMAL LOW (ref 12.0–15.0)
MCH: 27.7 pg (ref 26.0–34.0)
MCHC: 31.5 g/dL (ref 30.0–36.0)
MCV: 88.2 fL (ref 80.0–100.0)
Platelets: 323 10*3/uL (ref 150–400)
RBC: 3.46 MIL/uL — ABNORMAL LOW (ref 3.87–5.11)
RDW: 17.3 % — ABNORMAL HIGH (ref 11.5–15.5)
WBC: 12.7 10*3/uL — ABNORMAL HIGH (ref 4.0–10.5)
nRBC: 0 % (ref 0.0–0.2)

## 2019-01-02 LAB — SURGICAL PATHOLOGY

## 2019-01-02 MED ORDER — OXYCODONE HCL 5 MG PO TABS: 10.0000 mg | ORAL_TABLET | ORAL | 0 refills | Status: DC | PRN

## 2019-01-02 MED ORDER — ALPRAZOLAM 1 MG PO TABS: 1.0000 mg | ORAL_TABLET | Freq: Every evening | ORAL | 0 refills | Status: DC | PRN

## 2019-01-02 MED ORDER — IBUPROFEN 600 MG PO TABS: 600.0000 mg | ORAL_TABLET | Freq: Four times a day (QID) | ORAL | 1 refills | Status: DC | PRN

## 2019-01-02 MED FILL — oxyCODONE HCL 5 MG TABS: 5 | 3 days supply | Qty: 30 | Fill #0

## 2019-01-02 MED FILL — ALPRAZolam 1 MG TABS: 1 | 30 days supply | Qty: 30 | Fill #0

## 2019-01-02 MED FILL — IBUPROFEN 600 MG TABLET: 600 | 7 days supply | Qty: 30 | Fill #0

## 2019-01-02 NOTE — Telephone Encounter (Signed)
Patient called requesting to speak with Dr. Chapman Fitch regarding her hysterectomy. Patient had her surgery done on 01/01/19.

## 2019-01-02 NOTE — Telephone Encounter (Signed)
Cheryl Wiley, can you contact the patient to find out what questions she has and whether or not these might be questions that she needs to direct to her OB/GYN who just completed her hysterectomy.

## 2019-01-02 NOTE — Progress Notes (Signed)
Patient discharged to home. Verbalizes understanding of all discharge instructions including incision care, discharge medications, and follow up MD visits. Medications delivered to patient's room by [facility] prior to discharge. Awaiting ride.

## 2019-01-02 NOTE — Anesthesia Postprocedure Evaluation (Signed)
Anesthesia Post Note  Patient: Cheryl Wiley  Procedure(s) Performed: ABDOMINAL HYSTERECTOMY WITH SALPINGECTOMY (Bilateral Abdomen)     Patient location during evaluation: PACU Anesthesia Type: General Level of consciousness: awake and alert Pain management: pain level controlled Vital Signs Assessment: post-procedure vital signs reviewed and stable Respiratory status: spontaneous breathing, nonlabored ventilation, respiratory function stable and patient connected to nasal cannula oxygen Cardiovascular status: blood pressure returned to baseline and stable Postop Assessment: no apparent nausea or vomiting Anesthetic complications: no    Last Vitals:  Vitals:   01/02/19 0256 01/02/19 0640  BP: 117/80 (!) 147/92  Pulse: 72 74  Resp: 18 18  Temp: 36.8 C 36.9 C  SpO2: 99% 100%    Last Pain:  Vitals:   01/02/19 0640  TempSrc: Oral  PainSc:                  [place]

## 2019-01-02 NOTE — Discharge Summary (Signed)
Physician Discharge Summary  Patient ID: CHARLOT BOEPPLE MRN: [id number] DOB/AGE: [birth year] 38 y.o.  Admit date: 01/01/2019 Discharge date: 01/02/2019  Admission Diagnoses: chronic pelvic pain  Discharge Diagnoses: same Active Problems:   Pelvic pain   Post-operative state   Discharged Condition: good  Hospital Course: She underwent an uncomplicated TAH/BS and lysis of adhesions. By POD #1 she was ambulating, tolerating po well, having flatus and a BM. She voiced here readiness to go home.   Consults: None  Significant Diagnostic Studies: labs: post op hbg 9.6  Treatments: surgery: as above  Discharge Exam: Blood pressure 116/79, pulse 70, temperature 98.3 F (36.8 C), temperature source Oral, resp. rate 18, height 5\' 5"  (1.651 m), weight 92.1 kg, last menstrual period 12/05/2018, SpO2 100 %. General appearance: alert Resp: clear to auscultation bilaterally Cardio: regular rate and rhythm, S1, S2 normal, no murmur, click, rub or gallop GI: soft, non-tender; bowel sounds normal; no masses,  no organomegaly Incision/Wound: honeycomb dressing clean, dry, and intact  Disposition: Discharge disposition: 01-Home or Self Care         Follow-up Information    [name], MD. Schedule an appointment as soon as possible for a visit in 6 week(s).   Specialty: Obstetrics and Gynecology Contact information: [place] [address] [phone]           Signed: [name] 01/02/2019, 1:14 PM

## 2019-01-02 NOTE — Discharge Instructions (Signed)
Abdominal Hysterectomy, Care After This sheet gives you information about how to care for yourself after your procedure. Your health care provider may also give you more specific instructions. If you have problems or questions, contact your health care provider. What can I expect after the procedure? After the procedure, it is common to have:  Pain.  Tiredness (fatigue).  Poor appetite.  Lowered interest in sex.  Bleeding and discharge from your vagina. You may need to use a pad in your underwear after this procedure. Follow these instructions at home: Medicines  Take over-the-counter and prescription medicines only as told by your doctor.  Do not take aspirin or ibuprofen. These medicines can cause bleeding.  Ask your doctor if the medicine prescribed to you: ? Requires you to avoid driving or using heavy machinery. ? Can cause trouble pooping (constipation). You may need to take these actions to prevent or treat trouble pooping:  Take over-the-counter or prescription medicines.  Eat foods that are high in fiber. These include beans, whole grains, and fresh fruits and vegetables.  Limit foods that are high in fat and processed sugars. These include fried or sweet foods. Surgical cut (incision) care      Follow instructions from your doctor about how to take care of your cut from surgery (incision). Make sure you: ? Wash your hands with soap and water before and after you change your bandage (dressing). If you cannot use soap and water, use hand sanitizer. ? Change your bandage as told by your doctor. ? Leave stitches (sutures), skin glue, or skin tape (adhesive) strips in place. They may need to stay in place for 2 weeks or longer. If tape strips get loose and curl up, you may trim the loose edges. Do not remove tape strips completely unless your doctor says it is okay.  Check your cut from surgery every day for signs of infection. Check for: ? Redness, swelling, or  pain. ? Fluid or blood. ? Warmth. ? Pus or a bad smell. Activity  Rest as told by your doctor. ? Do not sit for a long time without moving. Get up to take short walks every 1-2 hours. This is important. Ask for help if you feel weak or unsteady.  Do not lift anything that is heavier than 10 lb (4.5 kg), or the limit that you are told, until your doctor says that it is safe.  Do not drive or use heavy machinery while taking prescription pain medicine.  Follow your doctor's advice about exercise, driving, and general activities. Return to your normal activities as told by your doctor. Ask your doctor what activities are safe for you. Lifestyle  Do not douche, use tampons, or have sex for at least 6 weeks or as told by your doctor.  Do not drink alcohol until your doctor says it is okay.  Do not use any products that contain nicotine or tobacco, such as cigarettes, e-cigarettes, and chewing tobacco. If you need help quitting, ask your doctor. General instructions   Drink enough fluid to keep your pee (urine) pale yellow.  Do not take baths, swim, or use a hot tub until your doctor approves. Ask your doctor if you may take showers. You may only be allowed to take sponge baths.  Try to have someone at home with you for the first 1-2 weeks to help you with your daily chores at home.  Keep the bandage dry until your doctor says it can be taken off.  Wear tight-fitting (  compression) stockings as told by your health care provider. These stockings help to prevent blood clots and reduce swelling in your legs. °· Keep all follow-up visits as told by your doctor. This is important. °Contact a doctor if: °· You have any of these signs of infection: °? Redness, swelling, or pain around your cut. °? Fluid or blood coming from your cut. °? Warmth coming from your cut. °? Pus or a bad smell coming from your cut. °? Chills or a fever. °· Your cut breaks open. °· You feel dizzy or light-headed. °· You  have pain or bleeding when you pee. °· You keep having watery poop (diarrhea). °· You keep feeling like you may vomit (nauseous) or keep vomiting. °· You have unusual fluid (discharge) coming from your vagina. °· You have a rash. °· You have any type of reaction to your medicine that is not normal, or you develop an allergy to your medicine. °· Your pain medicine does not help. °Get help right away if: °· You have a fever and your symptoms get worse all of a sudden. °· You have very bad pain in your belly (abdomen). °· You are short of breath. °· You faint. °· You have pain, swelling, or redness of your leg. °· You bleed a lot from your vagina and notice clumps of blood (clots). °Summary °· Do not take baths, swim, or use a hot tub until your doctor approves. Ask your doctor if you may take showers. You may only be allowed to take sponge baths. °· Do not lift anything that is heavier than 10 lb (4.5 kg), or the limit that you are told, until your doctor says that it is safe. °· Follow your doctor's advice about exercise, driving, and general activities. Ask your doctor what activities are safe for you. °· Try to have someone at home with you for the first 1-2 weeks to help you with your daily chores at home. °This information is not intended to replace advice given to you by your health care provider. Make sure you discuss any questions you have with your health care provider. °Document Released: 11/17/2007 Document Revised: 04/12/2018 Document Reviewed: 01/27/2016 °Elsevier Patient Education © 2020 [employer] ° °

## 2019-01-10 ENCOUNTER — Other Ambulatory Visit: Payer: Self-pay

## 2019-01-10 ENCOUNTER — Encounter: Payer: Self-pay | Attending: Physical Medicine & Rehabilitation | Admitting: Physical Medicine & Rehabilitation

## 2019-01-10 ENCOUNTER — Encounter: Payer: Self-pay | Admitting: Physical Medicine & Rehabilitation

## 2019-01-10 VITALS — BP 130/80 | HR 98 | Temp 97.9°F | Ht 65.0 in | Wt 204.0 lb

## 2019-01-10 DIAGNOSIS — G479 Sleep disorder, unspecified: Secondary | ICD-10-CM | POA: Insufficient documentation

## 2019-01-10 DIAGNOSIS — M47812 Spondylosis without myelopathy or radiculopathy, cervical region: Secondary | ICD-10-CM | POA: Insufficient documentation

## 2019-01-10 DIAGNOSIS — M4802 Spinal stenosis, cervical region: Secondary | ICD-10-CM | POA: Insufficient documentation

## 2019-01-10 DIAGNOSIS — M542 Cervicalgia: Secondary | ICD-10-CM | POA: Insufficient documentation

## 2019-01-10 DIAGNOSIS — M791 Myalgia, unspecified site: Secondary | ICD-10-CM | POA: Insufficient documentation

## 2019-01-10 MED ORDER — BACLOFEN 10 MG PO TABS: 10.0000 mg | ORAL_TABLET | Freq: Three times a day (TID) | ORAL | 1 refills | Status: DC

## 2019-01-10 MED ORDER — AMITRIPTYLINE HCL 25 MG PO TABS: 25.0000 mg | ORAL_TABLET | Freq: Every day | ORAL | 1 refills | Status: DC

## 2019-01-10 MED FILL — AMITRIPTYLINE HCL 25 MG TAB: 25 | 30 days supply | Qty: 30 | Fill #0

## 2019-01-10 MED FILL — BACLOFEN 10 MG TABLET: 10 | 30 days supply | Qty: 90 | Fill #0

## 2019-01-10 NOTE — Telephone Encounter (Signed)
Unable to leave voicemail.  Mailbox is full

## 2019-01-10 NOTE — Patient Instructions (Signed)
Please trial TENS unti  Please trial Lidocaine patch
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
## 2019-01-11 ENCOUNTER — Ambulatory Visit: Payer: Self-pay | Attending: Family Medicine

## 2019-01-11 NOTE — Telephone Encounter (Signed)
Spoke to patient. Name and DOB verified. She states she does not have any questions at this time. She had an appointment with Clifton James and referred question to him regarding CAFA or orange card.

## 2019-02-04 ENCOUNTER — Encounter: Payer: Self-pay | Admitting: Obstetrics & Gynecology

## 2019-02-04 ENCOUNTER — Other Ambulatory Visit: Payer: Self-pay

## 2019-02-04 ENCOUNTER — Ambulatory Visit (INDEPENDENT_AMBULATORY_CARE_PROVIDER_SITE_OTHER): Payer: Self-pay | Admitting: Obstetrics & Gynecology

## 2019-02-04 VITALS — BP 127/77 | HR 79 | Wt 200.4 lb

## 2019-02-04 DIAGNOSIS — Z9889 Other specified postprocedural states: Secondary | ICD-10-CM

## 2019-02-04 DIAGNOSIS — N809 Endometriosis, unspecified: Secondary | ICD-10-CM | POA: Insufficient documentation

## 2019-02-04 LAB — POCT URINALYSIS DIP (DEVICE)
Bilirubin Urine: NEGATIVE
Glucose, UA: NEGATIVE mg/dL
Hgb urine dipstick: NEGATIVE
Ketones, ur: 15 mg/dL — AB
Leukocytes,Ua: NEGATIVE
Nitrite: NEGATIVE
Protein, ur: NEGATIVE mg/dL
Specific Gravity, Urine: 1.025 (ref 1.005–1.030)
Urobilinogen, UA: 0.2 mg/dL (ref 0.0–1.0)
pH: 7 (ref 5.0–8.0)

## 2019-02-04 NOTE — Progress Notes (Signed)
   Subjective:    Patient ID: Cheryl Wiley, female    DOB: 02-24-1980, 38 y.o.   MRN: [id number]  HPI 38 yo lady is here for a post op visit. She had a TAH/BS on 01/31/19. She reports daily BMs and has some dysuria. She is requesting suboxone for her drug habit. She has not had sex since surgery and is not having any other problems. She returned to work last week.   Review of Systems Endometriosis was seen on the oviducts pathology.    Objective:   Physical Exam  Breathing, conversing, and ambulating normally Well nourished, well hydrated Black female, no apparent distress Abd- benign, incision- healed beautifully Vaginal cuff- healed well Bimanual exam- no masses or tenderness    Assessment & Plan:  Post op- doing well

## 2019-02-07 ENCOUNTER — Encounter: Payer: Self-pay | Admitting: Physical Medicine & Rehabilitation

## 2019-02-08 ENCOUNTER — Ambulatory Visit: Payer: Self-pay | Attending: Family Medicine | Admitting: Family Medicine

## 2019-02-08 ENCOUNTER — Encounter: Payer: Self-pay | Admitting: Family Medicine

## 2019-02-08 ENCOUNTER — Other Ambulatory Visit: Payer: Self-pay

## 2019-02-08 DIAGNOSIS — N808 Other endometriosis: Secondary | ICD-10-CM

## 2019-02-08 DIAGNOSIS — N809 Endometriosis, unspecified: Secondary | ICD-10-CM

## 2019-02-08 DIAGNOSIS — F411 Generalized anxiety disorder: Secondary | ICD-10-CM

## 2019-02-08 DIAGNOSIS — G8921 Chronic pain due to trauma: Secondary | ICD-10-CM

## 2019-02-08 DIAGNOSIS — K08109 Complete loss of teeth, unspecified cause, unspecified class: Secondary | ICD-10-CM

## 2019-02-08 MED ORDER — CITALOPRAM HYDROBROMIDE 20 MG PO TABS: 20.0000 mg | ORAL_TABLET | Freq: Every day | ORAL | 3 refills | Status: DC

## 2019-02-08 MED ORDER — IBUPROFEN 600 MG PO TABS: 600.0000 mg | ORAL_TABLET | Freq: Four times a day (QID) | ORAL | 5 refills | Status: DC | PRN

## 2019-02-08 MED FILL — CITALOPRAM HBR 20 MG TABLET: 20 | 30 days supply | Qty: 30 | Fill #0

## 2019-02-08 MED FILL — IBUPROFEN 600 MG TABLET: 600 | 20 days supply | Qty: 60 | Fill #0
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
## 2019-02-08 NOTE — Progress Notes (Signed)
Patient verified DOB Patient has not taken medication today. Patient has not eaten. Patient just got off work Patient denies of pain a this time.

## 2019-02-18 ENCOUNTER — Ambulatory Visit: Payer: Self-pay | Admitting: Family Medicine

## 2019-02-25 ENCOUNTER — Ambulatory Visit: Payer: Self-pay | Admitting: Physical Medicine & Rehabilitation

## 2019-02-25 ENCOUNTER — Encounter: Payer: Self-pay | Attending: Physical Medicine & Rehabilitation | Admitting: Physical Medicine & Rehabilitation

## 2019-02-25 DIAGNOSIS — G479 Sleep disorder, unspecified: Secondary | ICD-10-CM | POA: Insufficient documentation

## 2019-02-25 DIAGNOSIS — M47812 Spondylosis without myelopathy or radiculopathy, cervical region: Secondary | ICD-10-CM | POA: Insufficient documentation

## 2019-02-25 DIAGNOSIS — M791 Myalgia, unspecified site: Secondary | ICD-10-CM | POA: Insufficient documentation

## 2019-02-25 DIAGNOSIS — M4802 Spinal stenosis, cervical region: Secondary | ICD-10-CM | POA: Insufficient documentation

## 2019-02-25 DIAGNOSIS — M542 Cervicalgia: Secondary | ICD-10-CM | POA: Insufficient documentation

## 2019-02-28 ENCOUNTER — Ambulatory Visit: Payer: Self-pay | Admitting: Physical Medicine & Rehabilitation

## 2019-03-01 ENCOUNTER — Telehealth: Payer: Self-pay

## 2019-03-01 ENCOUNTER — Other Ambulatory Visit: Payer: Self-pay

## 2019-03-01 ENCOUNTER — Telehealth: Payer: Self-pay | Admitting: Licensed Clinical Social Worker

## 2019-03-01 ENCOUNTER — Other Ambulatory Visit: Payer: Self-pay | Admitting: Family Medicine

## 2019-03-01 DIAGNOSIS — F411 Generalized anxiety disorder: Secondary | ICD-10-CM

## 2019-03-01 MED ORDER — ALPRAZOLAM 1 MG PO TABS: 1.0000 mg | ORAL_TABLET | Freq: Every evening | ORAL | 0 refills | Status: AC | PRN

## 2019-03-01 MED ORDER — ALPRAZOLAM 1 MG PO TABS: 1.0000 mg | ORAL_TABLET | Freq: Every evening | ORAL | 0 refills | Status: DC | PRN

## 2019-03-01 MED FILL — ALPRAZolam 1 MG TABS: 1 | 30 days supply | Qty: 30 | Fill #0

## 2019-03-01 MED FILL — AMITRIPTYLINE HCL 25 MG TAB: 25 | 30 days supply | Qty: 30 | Fill #0

## 2019-03-01 MED FILL — IBUPROFEN 600 MG TABLET: 600 | 20 days supply | Qty: 60 | Fill #0

## 2019-03-01 MED FILL — BACLOFEN 10 MG TABLET: 10 | 30 days supply | Qty: 90 | Fill #0

## 2019-03-01 MED FILL — CITALOPRAM HBR 20 MG TABLET: 20 | 30 days supply | Qty: 30 | Fill #0

## 2019-03-01 NOTE — Progress Notes (Signed)
Patient ID: Cheryl Wiley, female   DOB: [birth year], 39 y.o.   MRN: [id number]   Requested RX for alprazolam which patient was made aware would only be sent in as short term supply until she can follow-up with a mental health provider, was sent to [facility].

## 2019-03-01 NOTE — Telephone Encounter (Signed)
Previously discussed with patient that this medication, alprazolam is generally not prescribed through this office long-term as it is a controlled substance.  We will send in 30-day supply while patient establishes care with mental health provider as discussed at her last visit.

## 2019-03-01 NOTE — Telephone Encounter (Signed)
Patient is needing Xanax sent to another pharmacy.

## 2019-03-01 NOTE — Telephone Encounter (Signed)
Please fill if appropriate.  

## 2019-03-01 NOTE — Telephone Encounter (Signed)
Call placed to patient. Message stating call could not be completed at this time.

## 2019-03-19 ENCOUNTER — Encounter: Payer: Self-pay | Admitting: Physical Medicine & Rehabilitation

## 2019-03-21 ENCOUNTER — Telehealth: Payer: Self-pay | Admitting: Licensed Clinical Social Worker

## 2019-03-21 NOTE — Telephone Encounter (Signed)
Call placed to patient regarding IBH referral. LCSW introduced self and explained role at [facility].   Pt shared interest in suboxone treatment, in addition, to behavioral health services. LCSW discussed local supportive resources and agreed to email information, per patient request.   LCSW strongly encouraged patient to contact her with any additional behavioral health and/or resource needs.

## 2019-04-02 ENCOUNTER — Other Ambulatory Visit: Payer: Self-pay | Admitting: Family Medicine

## 2019-04-02 DIAGNOSIS — F411 Generalized anxiety disorder: Secondary | ICD-10-CM

## 2019-04-05 ENCOUNTER — Other Ambulatory Visit: Payer: Self-pay | Admitting: Family Medicine

## 2019-04-05 DIAGNOSIS — F411 Generalized anxiety disorder: Secondary | ICD-10-CM

## 2019-04-09 ENCOUNTER — Encounter: Payer: Self-pay | Attending: Physical Medicine & Rehabilitation | Admitting: Physical Medicine & Rehabilitation

## 2019-04-09 DIAGNOSIS — M4802 Spinal stenosis, cervical region: Secondary | ICD-10-CM | POA: Insufficient documentation

## 2019-04-09 DIAGNOSIS — M542 Cervicalgia: Secondary | ICD-10-CM | POA: Insufficient documentation

## 2019-04-09 DIAGNOSIS — M791 Myalgia, unspecified site: Secondary | ICD-10-CM | POA: Insufficient documentation

## 2019-04-09 DIAGNOSIS — M47812 Spondylosis without myelopathy or radiculopathy, cervical region: Secondary | ICD-10-CM | POA: Insufficient documentation

## 2019-04-09 DIAGNOSIS — G479 Sleep disorder, unspecified: Secondary | ICD-10-CM | POA: Insufficient documentation

## 2019-04-10 ENCOUNTER — Telehealth: Payer: Self-pay

## 2019-04-10 NOTE — Telephone Encounter (Signed)
Patient would like a call from the social worker.

## 2019-04-11 ENCOUNTER — Ambulatory Visit: Payer: Self-pay | Attending: Family Medicine | Admitting: Licensed Clinical Social Worker

## 2019-04-11 ENCOUNTER — Other Ambulatory Visit: Payer: Self-pay

## 2019-04-11 DIAGNOSIS — F411 Generalized anxiety disorder: Secondary | ICD-10-CM

## 2019-04-11 NOTE — Progress Notes (Signed)
[place] Visit via Telemedicine (Telephone)  04/11/2019 CATHI SWEDENBURG [id number]   Session Start time: 8:37 AM  Session End time: 9:03 AM Total time: 26  Referring Provider: Dr. Chapman Fitch Type of Visit: Telephonic Patient location: Home [facility] Provider location: Office All persons participating in visit: LCSW and Patient  Confirmed patient's address: Yes  Confirmed patient's phone number: Yes  Any changes to demographics: No   Confirmed patient's insurance: Yes  Any changes to patient's insurance: No   Discussed confidentiality: Yes    The following statements were read to the patient and/or legal guardian that are established with the [facility],The Provider.  "The purpose of this phone visit is to provide behavioral health care while limiting exposure to the coronavirus (COVID19).  There is a possibility of technology failure and discussed alternative modes of communication if that failure occurs."  "By engaging in this telephone visit, you consent to the provision of healthcare.  Additionally, you authorize for your insurance to be billed for the services provided during this telephone visit."   Patient and/or legal guardian consented to telephone visit: Yes   PRESENTING CONCERNS: Patient and/or family reports the following symptoms/concerns: Pt reports increase in anxiety symptoms. She shared that she has not picked up citalopram noting that she has been off of medicine for approximately two months due to it making her feel "numb". Due to symptoms pt has not been to work Duration of problem: Ongoing, [place] provided free (greater images) received counseling and med management; Severity of problem: severe  STRENGTHS (Protective Factors/Coping Skills): Pt has desire to change  GOALS ADDRESSED: Patient will: 1.  Reduce symptoms of: anxiety  2.  Increase knowledge and/or ability of: coping skills  3.  Demonstrate ability to: Increase  healthy adjustment to current life circumstances and Increase adequate support systems for patient/family  INTERVENTIONS: Interventions utilized:  Solution-Focused Strategies Standardized Assessments completed: Not Needed  ASSESSMENT: Patient currently experiencing increase in depression and anxiety symptoms resulting in pt's inability to maintain employment.   Patient may benefit from medication management and brief therapy. Pt reports that she did not receive supportive resources via email. LCSW provided information over the phone and strongly encouraged pt to establish services to assist in management of symptoms.  PLAN: 1. Follow up with behavioral health clinician on : 04/22/19 2. Behavioral recommendations: Utilize resources and strategies discussed 3. Referral(s): [facility])  Rebekah Chesterfield, [place] 05/03/19 12:06 PM

## 2019-04-22 ENCOUNTER — Other Ambulatory Visit: Payer: Self-pay

## 2019-04-22 ENCOUNTER — Ambulatory Visit: Payer: Self-pay | Attending: Family Medicine | Admitting: Licensed Clinical Social Worker

## 2019-04-22 DIAGNOSIS — F411 Generalized anxiety disorder: Secondary | ICD-10-CM

## 2019-04-22 DIAGNOSIS — Z5320 Procedure and treatment not carried out because of patient's decision for unspecified reasons: Secondary | ICD-10-CM

## 2019-05-06 ENCOUNTER — Telehealth: Payer: Self-pay | Admitting: Licensed Clinical Social Worker

## 2019-05-06 NOTE — BH Specialist Note (Signed)
LCSW placed call to patient regarding scheduled IBH appointment. Message requesting a return call was left.   LCSW was unable to cancel arrival status due to end of day processing.

## 2019-05-06 NOTE — Telephone Encounter (Signed)
Follow up call placed to patient to inquire about establishing services in the community. Pt shared that she has an upcoming appointment with [facility] scheduled for June 04, 2019. No additional concerns noted.

## 2019-07-25 ENCOUNTER — Emergency Department (HOSPITAL_COMMUNITY)
Admission: EM | Admit: 2019-07-25 | Discharge: 2019-07-26 | Discharge status: Against Medical Advice | Payer: Self-pay | Attending: Emergency Medicine | Admitting: Emergency Medicine

## 2019-07-25 ENCOUNTER — Encounter (HOSPITAL_COMMUNITY): Payer: Self-pay

## 2019-07-25 DIAGNOSIS — Z5321 Procedure and treatment not carried out due to patient leaving prior to being seen by health care provider: Secondary | ICD-10-CM | POA: Insufficient documentation

## 2019-07-25 DIAGNOSIS — T50902A Poisoning by unspecified drugs, medicaments and biological substances, intentional self-harm, initial encounter: Secondary | ICD-10-CM | POA: Insufficient documentation

## 2019-07-25 LAB — COMPREHENSIVE METABOLIC PANEL
ALT: 20 U/L (ref 0–44)
AST: 25 U/L (ref 15–41)
Albumin: 3.3 g/dL — ABNORMAL LOW (ref 3.5–5.0)
Alkaline Phosphatase: 38 U/L (ref 38–126)
Anion gap: 10 (ref 5–15)
BUN: 8 mg/dL (ref 6–20)
CO2: 27 mmol/L (ref 22–32)
Calcium: 8.6 mg/dL — ABNORMAL LOW (ref 8.9–10.3)
Chloride: 100 mmol/L (ref 98–111)
Creatinine, Ser: 0.8 mg/dL (ref 0.44–1.00)
GFR calc Af Amer: >60 mL/min (ref >60)
GFR calc non Af Amer: >60 mL/min (ref >60)
Glucose, Bld: 119 mg/dL — ABNORMAL HIGH (ref 70–99)
Potassium: 3.6 mmol/L (ref 3.5–5.1)
Sodium: 137 mmol/L (ref 135–145)
Total Bilirubin: 0.3 mg/dL (ref 0.3–1.2)
Total Protein: 5.8 g/dL — ABNORMAL LOW (ref 6.5–8.1)

## 2019-07-25 LAB — CBC
HCT: 46.2 % — ABNORMAL HIGH (ref 36.0–46.0)
Hemoglobin: 14.1 g/dL (ref 12.0–15.0)
MCH: 27.9 pg (ref 26.0–34.0)
MCHC: 30.5 g/dL (ref 30.0–36.0)
MCV: 91.3 fL (ref 80.0–100.0)
Platelets: 354 10*3/uL (ref 150–400)
RBC: 5.06 MIL/uL (ref 3.87–5.11)
RDW: 14.6 % (ref 11.5–15.5)
WBC: 10.9 10*3/uL — ABNORMAL HIGH (ref 4.0–10.5)
nRBC: 0 % (ref 0.0–0.2)

## 2019-07-25 LAB — RAPID URINE DRUG SCREEN, HOSP PERFORMED
Amphetamines: NOT DETECTED
Barbiturates: NOT DETECTED
Benzodiazepines: NOT DETECTED
Cocaine: NOT DETECTED
Opiates: NOT DETECTED
Tetrahydrocannabinol: POSITIVE — AB

## 2019-07-25 LAB — ETHANOL: Alcohol, Ethyl (B): <10 mg/dL (ref <10)

## 2019-07-25 LAB — I-STAT BETA HCG BLOOD, ED (MC, WL, AP ONLY): I-stat hCG, quantitative: <5 m[IU]/mL (ref <5)

## 2019-07-25 LAB — ACETAMINOPHEN LEVEL: Acetaminophen (Tylenol), Serum: <10 ug/mL — ABNORMAL LOW (ref 10–30)

## 2019-07-25 NOTE — ED Triage Notes (Signed)
Pt bib [facility] [place]. Pt states she took percocet. Per EMS pt initially w/ agonal breaths but became responsive w/ 2 rescue breaths. Pt did not receive any narcan w/ EMS. Pt AOx4 on arrival to ED, all EMS VSS.

## 2019-07-26 NOTE — ED Notes (Signed)
Pt called multiple times no answer 

## 2019-08-15 ENCOUNTER — Other Ambulatory Visit: Payer: Self-pay | Admitting: Family Medicine

## 2019-08-15 ENCOUNTER — Encounter: Payer: Self-pay | Admitting: Obstetrics and Gynecology

## 2019-08-15 ENCOUNTER — Other Ambulatory Visit: Payer: Self-pay

## 2019-08-15 ENCOUNTER — Ambulatory Visit (INDEPENDENT_AMBULATORY_CARE_PROVIDER_SITE_OTHER): Payer: Self-pay | Admitting: Obstetrics and Gynecology

## 2019-08-15 ENCOUNTER — Other Ambulatory Visit (HOSPITAL_COMMUNITY)
Admission: RE | Admit: 2019-08-15 | Discharge: 2019-08-15 | Disposition: A | Discharge status: Home / Self Care | Payer: Self-pay | Source: Ambulatory Visit | Attending: Obstetrics and Gynecology | Admitting: Obstetrics and Gynecology

## 2019-08-15 VITALS — BP 126/82 | HR 79 | Ht 65.0 in | Wt 194.1 lb

## 2019-08-15 DIAGNOSIS — Z202 Contact with and (suspected) exposure to infections with a predominantly sexual mode of transmission: Secondary | ICD-10-CM

## 2019-08-15 DIAGNOSIS — F411 Generalized anxiety disorder: Secondary | ICD-10-CM

## 2019-08-15 DIAGNOSIS — Z01419 Encounter for gynecological examination (general) (routine) without abnormal findings: Secondary | ICD-10-CM

## 2019-08-15 DIAGNOSIS — E049 Nontoxic goiter, unspecified: Secondary | ICD-10-CM

## 2019-08-15 LAB — POCT URINALYSIS DIP (DEVICE)
Bilirubin Urine: NEGATIVE
Glucose, UA: NEGATIVE mg/dL
Hgb urine dipstick: NEGATIVE
Ketones, ur: NEGATIVE mg/dL
Leukocytes,Ua: NEGATIVE
Nitrite: NEGATIVE
Protein, ur: NEGATIVE mg/dL
Specific Gravity, Urine: 1.025 (ref 1.005–1.030)
Urobilinogen, UA: 0.2 mg/dL (ref 0.0–1.0)
pH: 7 (ref 5.0–8.0)
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
## 2019-08-16 ENCOUNTER — Other Ambulatory Visit: Payer: Self-pay | Admitting: Obstetrics and Gynecology

## 2019-08-16 ENCOUNTER — Telehealth: Payer: Self-pay | Admitting: Obstetrics and Gynecology

## 2019-08-16 LAB — HEPATITIS C ANTIBODY: Hep C Virus Ab: <0.1 s/co ratio (ref 0.0–0.9)

## 2019-08-16 LAB — HEPATITIS B SURFACE ANTIGEN: Hepatitis B Surface Ag: NEGATIVE

## 2019-08-16 LAB — HIV ANTIBODY (ROUTINE TESTING W REFLEX): HIV Screen 4th Generation wRfx: NONREACTIVE

## 2019-08-16 LAB — CERVICOVAGINAL ANCILLARY ONLY
Bacterial Vaginitis (gardnerella): POSITIVE — AB
Candida Glabrata: NEGATIVE
Candida Vaginitis: POSITIVE — AB
Chlamydia: NEGATIVE
Comment: NEGATIVE
Comment: NEGATIVE
Comment: NEGATIVE
Comment: NEGATIVE
Comment: NEGATIVE
Comment: NORMAL
Neisseria Gonorrhea: NEGATIVE
Trichomonas: POSITIVE — AB

## 2019-08-16 LAB — THYROID PANEL WITH TSH
Free Thyroxine Index: 2.1 (ref 1.2–4.9)
T3 Uptake Ratio: 26 % (ref 24–39)
T4, Total: 8.2 ug/dL (ref 4.5–12.0)
TSH: 0.648 u[IU]/mL (ref 0.450–4.500)

## 2019-08-16 LAB — RPR: RPR Ser Ql: NONREACTIVE

## 2019-08-16 MED ORDER — METRONIDAZOLE 500 MG PO TABS: 500.0000 mg | ORAL_TABLET | Freq: Two times a day (BID) | ORAL | 0 refills | Status: AC

## 2019-08-16 MED ORDER — FLUCONAZOLE 150 MG PO TABS: 150.0000 mg | ORAL_TABLET | Freq: Every day | ORAL | 0 refills | Status: DC

## 2019-08-16 NOTE — Telephone Encounter (Signed)
+   trichomonas, bacterial vaginosis and yeast. Patient notified of results.   Rx: Diflucan, Flagyl. No alcohol while taking Flagyl  Lezlie Lye, NP 08/16/2019 7:32 PM

## 2019-08-19 MED FILL — metroNIDAZOLE 500 MG TABS: 500 | 7 days supply | Qty: 14 | Fill #0

## 2019-08-19 MED FILL — FLUCONAZOLE 150 MG TABLET: 150 | 2 days supply | Qty: 2 | Fill #0

## 2019-11-01 ENCOUNTER — Other Ambulatory Visit: Payer: Self-pay

## 2019-11-13 ENCOUNTER — Other Ambulatory Visit: Payer: Self-pay

## 2019-11-13 ENCOUNTER — Ambulatory Visit: Payer: Self-pay | Attending: Family Medicine | Admitting: Family Medicine

## 2019-11-13 ENCOUNTER — Encounter: Payer: Self-pay | Admitting: Family Medicine

## 2019-11-13 VITALS — BP 137/83 | HR 89 | Temp 98.6°F | Resp 16 | Wt 194.6 lb

## 2019-11-13 DIAGNOSIS — E049 Nontoxic goiter, unspecified: Secondary | ICD-10-CM

## 2019-11-13 DIAGNOSIS — Z720 Tobacco use: Secondary | ICD-10-CM

## 2019-11-13 DIAGNOSIS — R490 Dysphonia: Secondary | ICD-10-CM

## 2019-11-13 DIAGNOSIS — F411 Generalized anxiety disorder: Secondary | ICD-10-CM

## 2019-11-13 MED ORDER — BUSPIRONE HCL 10 MG PO TABS: 10.0000 mg | ORAL_TABLET | Freq: Three times a day (TID) | ORAL | 3 refills | Status: DC

## 2019-11-13 MED FILL — busPIRone HCL 10 MG TABS: 10 | 30 days supply | Qty: 90 | Fill #0
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
## 2019-11-20 ENCOUNTER — Ambulatory Visit: Payer: Self-pay

## 2019-12-06 ENCOUNTER — Telehealth: Payer: Self-pay | Admitting: Licensed Clinical Social Worker

## 2019-12-06 NOTE — Telephone Encounter (Signed)
Call placed to patient regarding IBH referral. LCSW left message requesting a return call.  

## 2019-12-06 NOTE — Telephone Encounter (Signed)
Pt returned call, tried calling office. Please advise  

## 2019-12-12 ENCOUNTER — Telehealth: Payer: Self-pay | Admitting: Licensed Clinical Social Worker
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
## 2019-12-13 ENCOUNTER — Telehealth: Payer: Self-pay | Admitting: Licensed Clinical Social Worker

## 2019-12-13 NOTE — Telephone Encounter (Signed)
Completed Legal Aid referral placed.

## 2019-12-19 ENCOUNTER — Other Ambulatory Visit: Payer: Self-pay

## 2019-12-19 ENCOUNTER — Telehealth: Payer: Self-pay | Admitting: Licensed Clinical Social Worker

## 2019-12-19 ENCOUNTER — Ambulatory Visit: Payer: Self-pay | Attending: Family Medicine | Admitting: Licensed Clinical Social Worker

## 2019-12-19 NOTE — Telephone Encounter (Signed)
Call placed to patient regarding scheduled IBH referral. LCSW left message requesting a return call.

## 2020-03-16 ENCOUNTER — Telehealth: Payer: Self-pay | Admitting: Family Medicine

## 2020-03-17 ENCOUNTER — Other Ambulatory Visit (HOSPITAL_COMMUNITY)
Admission: RE | Admit: 2020-03-17 | Discharge: 2020-03-17 | Disposition: A | Discharge status: Home / Self Care | Payer: Self-pay | Source: Ambulatory Visit | Attending: Family Medicine | Admitting: Family Medicine

## 2020-03-17 ENCOUNTER — Ambulatory Visit (INDEPENDENT_AMBULATORY_CARE_PROVIDER_SITE_OTHER): Payer: Self-pay

## 2020-03-17 ENCOUNTER — Other Ambulatory Visit: Payer: Self-pay

## 2020-03-17 VITALS — BP 120/82 | HR 77 | Wt 177.7 lb

## 2020-03-17 DIAGNOSIS — Z1331 Encounter for screening for depression: Secondary | ICD-10-CM

## 2020-03-17 DIAGNOSIS — Z202 Contact with and (suspected) exposure to infections with a predominantly sexual mode of transmission: Secondary | ICD-10-CM | POA: Insufficient documentation

## 2020-03-17 NOTE — Progress Notes (Addendum)
Pt here today requesting STD screening because she feels that her significant other is being unfaithful.  Pt reports that she has had Trich x 2 in the past and is not sure if he is taking the treatment.  Pt explained how to self swab and that we will call with abnormal results.  Pt reports that she not been taking her mood medications and is "basically homeless".  Food bag provided to the pt.  Amb ref sent to [facility] and pt information given to [facility] for food and future resources.  Pt verbalized understanding with no further questions.   Mel Almond, RN  03/17/20  Chart reviewed for nurse visit. Agree with plan of care.   Virginia Rochester, NP 03/18/2020 10:06 PM

## 2020-03-18 ENCOUNTER — Other Ambulatory Visit: Payer: Self-pay | Admitting: Nurse Practitioner

## 2020-03-18 LAB — CERVICOVAGINAL ANCILLARY ONLY
Bacterial Vaginitis (gardnerella): POSITIVE — AB
Candida Glabrata: NEGATIVE
Candida Vaginitis: NEGATIVE
Chlamydia: NEGATIVE
Comment: NEGATIVE
Comment: NEGATIVE
Comment: NEGATIVE
Comment: NEGATIVE
Comment: NEGATIVE
Comment: NORMAL
Neisseria Gonorrhea: NEGATIVE
Trichomonas: POSITIVE — AB

## 2020-03-18 MED ORDER — METRONIDAZOLE 500 MG PO TABS: 500.0000 mg | ORAL_TABLET | Freq: Two times a day (BID) | ORAL | 0 refills | Status: DC

## 2020-03-19 ENCOUNTER — Telehealth: Payer: Self-pay

## 2020-03-19 MED FILL — metroNIDAZOLE 500 MG TABS: 500 | 7 days supply | Qty: 14 | Fill #0

## 2020-03-19 NOTE — Telephone Encounter (Signed)
-----   Message from Cheryl Rochester, NP sent at 03/18/2020 10:13 PM EST ----- Trichomonas noted.  Please notify client and make sure she got the MyChart note and has picked up her medication from the pharmacy.
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
## 2020-03-30 NOTE — Telephone Encounter (Signed)
error 

## 2020-04-06 NOTE — BH Specialist Note (Signed)
Pt did not arrive to video visit and did not answer the phone; Unable to leave voice message; left MyChart message for patient.   

## 2020-04-20 ENCOUNTER — Ambulatory Visit: Payer: Self-pay | Admitting: Clinical

## 2020-04-20 DIAGNOSIS — Z5329 Procedure and treatment not carried out because of patient's decision for other reasons: Secondary | ICD-10-CM

## 2020-04-20 DIAGNOSIS — Z91199 Patient's noncompliance with other medical treatment and regimen due to unspecified reason: Secondary | ICD-10-CM

## 2020-05-09 ENCOUNTER — Encounter (HOSPITAL_COMMUNITY): Payer: Self-pay

## 2020-05-09 ENCOUNTER — Other Ambulatory Visit: Payer: Self-pay

## 2020-05-09 ENCOUNTER — Emergency Department (HOSPITAL_COMMUNITY)
Admission: EM | Admit: 2020-05-09 | Discharge: 2020-05-09 | Disposition: A | Discharge status: Home / Self Care | Payer: Self-pay | Attending: Emergency Medicine | Admitting: Emergency Medicine

## 2020-05-09 ENCOUNTER — Other Ambulatory Visit: Payer: Self-pay | Admitting: Emergency Medicine

## 2020-05-09 DIAGNOSIS — S39012A Strain of muscle, fascia and tendon of lower back, initial encounter: Secondary | ICD-10-CM | POA: Insufficient documentation

## 2020-05-09 DIAGNOSIS — M79661 Pain in right lower leg: Secondary | ICD-10-CM | POA: Insufficient documentation

## 2020-05-09 DIAGNOSIS — M542 Cervicalgia: Secondary | ICD-10-CM | POA: Diagnosis not present

## 2020-05-09 DIAGNOSIS — M79662 Pain in left lower leg: Secondary | ICD-10-CM | POA: Diagnosis not present

## 2020-05-09 DIAGNOSIS — F17210 Nicotine dependence, cigarettes, uncomplicated: Secondary | ICD-10-CM | POA: Diagnosis not present

## 2020-05-09 DIAGNOSIS — Y92410 Unspecified street and highway as the place of occurrence of the external cause: Secondary | ICD-10-CM | POA: Insufficient documentation

## 2020-05-09 DIAGNOSIS — S3992XA Unspecified injury of lower back, initial encounter: Secondary | ICD-10-CM | POA: Diagnosis present

## 2020-05-09 MED ORDER — NAPROXEN 500 MG PO TABS: 500.0000 mg | ORAL_TABLET | Freq: Two times a day (BID) | ORAL | 0 refills | Status: AC

## 2020-05-09 MED ORDER — METHOCARBAMOL 500 MG PO TABS: 500.0000 mg | ORAL_TABLET | Freq: Two times a day (BID) | ORAL | 0 refills | Status: AC | PRN
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
## 2020-05-09 NOTE — Discharge Instructions (Signed)
Your symptoms are consistent with having a muscle strain of your back and your legs, you do not need any x-rays you will not need surgery and this will get better over the next 1 or 2 weeks.  Naproxen twice a day for pain  Robaxin for muscle spasms, use hot or cold packs if they help, seek medical exam for severe or worsening symptoms, but do not drive if you are taking muscle relaxers
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
## 2020-05-09 NOTE — ED Notes (Addendum)
Pt seen ambulating to exit. Legrand Como, CNA advised that pt is upset that no imaging had been ordered. Pt ambulated without difficulty.

## 2020-05-11 MED FILL — NAPROXEN 500 MG TABLET: 500 | 15 days supply | Qty: 30 | Fill #0

## 2020-05-11 MED FILL — METHOCARBAMOL 500 MG TABS: 500 | 10 days supply | Qty: 20 | Fill #0
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
